# Patient Record
Sex: Male | Born: 1935 | Race: White | Hispanic: No | Marital: Married | Smoking: Never smoker
Health system: Southern US, Community
[De-identification: ages and names within clinical notes are randomized; demographics above are authoritative.]

## PROBLEM LIST (undated history)

## (undated) DIAGNOSIS — K219 Gastro-esophageal reflux disease without esophagitis: Secondary | ICD-10-CM

## (undated) DIAGNOSIS — M1990 Unspecified osteoarthritis, unspecified site: Secondary | ICD-10-CM

## (undated) DIAGNOSIS — E119 Type 2 diabetes mellitus without complications: Secondary | ICD-10-CM

## (undated) DIAGNOSIS — I10 Essential (primary) hypertension: Secondary | ICD-10-CM

## (undated) HISTORY — PX: COLONOSCOPY: SHX174

## (undated) HISTORY — PX: TONSILLECTOMY: SUR1361

---

## 1958-06-13 HISTORY — PX: VASCULAR SURGERY: SHX849

## 2001-07-10 ENCOUNTER — Ambulatory Visit (HOSPITAL_COMMUNITY): Admission: RE | Admit: 2001-07-10 | Discharge: 2001-07-10 | Payer: Self-pay | Admitting: Internal Medicine

## 2001-07-10 ENCOUNTER — Encounter: Payer: Self-pay | Admitting: Internal Medicine

## 2003-06-14 HISTORY — PX: FRACTURE SURGERY: SHX138

## 2015-08-06 DIAGNOSIS — Z9181 History of falling: Secondary | ICD-10-CM | POA: Diagnosis not present

## 2015-08-06 DIAGNOSIS — Z79899 Other long term (current) drug therapy: Secondary | ICD-10-CM | POA: Diagnosis not present

## 2015-08-06 DIAGNOSIS — Z1211 Encounter for screening for malignant neoplasm of colon: Secondary | ICD-10-CM | POA: Diagnosis not present

## 2015-08-06 DIAGNOSIS — E1165 Type 2 diabetes mellitus with hyperglycemia: Secondary | ICD-10-CM | POA: Diagnosis not present

## 2015-08-06 DIAGNOSIS — Z1389 Encounter for screening for other disorder: Secondary | ICD-10-CM | POA: Diagnosis not present

## 2015-08-06 DIAGNOSIS — K219 Gastro-esophageal reflux disease without esophagitis: Secondary | ICD-10-CM | POA: Diagnosis not present

## 2015-08-06 DIAGNOSIS — S46111A Strain of muscle, fascia and tendon of long head of biceps, right arm, initial encounter: Secondary | ICD-10-CM | POA: Diagnosis not present

## 2015-08-06 DIAGNOSIS — Z125 Encounter for screening for malignant neoplasm of prostate: Secondary | ICD-10-CM | POA: Diagnosis not present

## 2015-08-06 DIAGNOSIS — E782 Mixed hyperlipidemia: Secondary | ICD-10-CM | POA: Diagnosis not present

## 2015-08-06 DIAGNOSIS — I10 Essential (primary) hypertension: Secondary | ICD-10-CM | POA: Diagnosis not present

## 2015-08-17 DIAGNOSIS — E871 Hypo-osmolality and hyponatremia: Secondary | ICD-10-CM | POA: Diagnosis not present

## 2015-11-11 DIAGNOSIS — E782 Mixed hyperlipidemia: Secondary | ICD-10-CM | POA: Diagnosis not present

## 2015-11-11 DIAGNOSIS — E1165 Type 2 diabetes mellitus with hyperglycemia: Secondary | ICD-10-CM | POA: Diagnosis not present

## 2015-11-11 DIAGNOSIS — Z79899 Other long term (current) drug therapy: Secondary | ICD-10-CM | POA: Diagnosis not present

## 2015-11-16 DIAGNOSIS — E1165 Type 2 diabetes mellitus with hyperglycemia: Secondary | ICD-10-CM | POA: Diagnosis not present

## 2015-11-16 DIAGNOSIS — E782 Mixed hyperlipidemia: Secondary | ICD-10-CM | POA: Diagnosis not present

## 2015-11-16 DIAGNOSIS — I10 Essential (primary) hypertension: Secondary | ICD-10-CM | POA: Diagnosis not present

## 2016-04-21 DIAGNOSIS — Z23 Encounter for immunization: Secondary | ICD-10-CM | POA: Diagnosis not present

## 2016-05-20 DIAGNOSIS — E1165 Type 2 diabetes mellitus with hyperglycemia: Secondary | ICD-10-CM | POA: Diagnosis not present

## 2016-05-20 DIAGNOSIS — E782 Mixed hyperlipidemia: Secondary | ICD-10-CM | POA: Diagnosis not present

## 2016-05-27 DIAGNOSIS — I10 Essential (primary) hypertension: Secondary | ICD-10-CM | POA: Diagnosis not present

## 2016-05-27 DIAGNOSIS — E1165 Type 2 diabetes mellitus with hyperglycemia: Secondary | ICD-10-CM | POA: Diagnosis not present

## 2016-05-27 DIAGNOSIS — E782 Mixed hyperlipidemia: Secondary | ICD-10-CM | POA: Diagnosis not present

## 2016-07-04 DIAGNOSIS — S43421A Sprain of right rotator cuff capsule, initial encounter: Secondary | ICD-10-CM | POA: Diagnosis not present

## 2016-07-12 DIAGNOSIS — I10 Essential (primary) hypertension: Secondary | ICD-10-CM | POA: Diagnosis not present

## 2016-07-12 DIAGNOSIS — H353111 Nonexudative age-related macular degeneration, right eye, early dry stage: Secondary | ICD-10-CM | POA: Diagnosis not present

## 2016-07-12 DIAGNOSIS — H25813 Combined forms of age-related cataract, bilateral: Secondary | ICD-10-CM | POA: Diagnosis not present

## 2016-07-12 DIAGNOSIS — H524 Presbyopia: Secondary | ICD-10-CM | POA: Diagnosis not present

## 2016-07-12 DIAGNOSIS — E119 Type 2 diabetes mellitus without complications: Secondary | ICD-10-CM | POA: Diagnosis not present

## 2016-07-12 DIAGNOSIS — H5213 Myopia, bilateral: Secondary | ICD-10-CM | POA: Diagnosis not present

## 2016-07-12 DIAGNOSIS — Z7984 Long term (current) use of oral hypoglycemic drugs: Secondary | ICD-10-CM | POA: Diagnosis not present

## 2016-11-28 DIAGNOSIS — Z125 Encounter for screening for malignant neoplasm of prostate: Secondary | ICD-10-CM | POA: Diagnosis not present

## 2016-11-28 DIAGNOSIS — E782 Mixed hyperlipidemia: Secondary | ICD-10-CM | POA: Diagnosis not present

## 2016-11-28 DIAGNOSIS — E1165 Type 2 diabetes mellitus with hyperglycemia: Secondary | ICD-10-CM | POA: Diagnosis not present

## 2016-11-28 DIAGNOSIS — N138 Other obstructive and reflux uropathy: Secondary | ICD-10-CM | POA: Diagnosis not present

## 2016-11-28 DIAGNOSIS — N401 Enlarged prostate with lower urinary tract symptoms: Secondary | ICD-10-CM | POA: Diagnosis not present

## 2016-11-28 DIAGNOSIS — I10 Essential (primary) hypertension: Secondary | ICD-10-CM | POA: Diagnosis not present

## 2017-01-09 DIAGNOSIS — I10 Essential (primary) hypertension: Secondary | ICD-10-CM | POA: Diagnosis not present

## 2017-01-09 DIAGNOSIS — H25813 Combined forms of age-related cataract, bilateral: Secondary | ICD-10-CM | POA: Diagnosis not present

## 2017-04-12 DIAGNOSIS — Z23 Encounter for immunization: Secondary | ICD-10-CM | POA: Diagnosis not present

## 2017-05-23 DIAGNOSIS — M216X1 Other acquired deformities of right foot: Secondary | ICD-10-CM | POA: Diagnosis not present

## 2017-05-23 DIAGNOSIS — M2041 Other hammer toe(s) (acquired), right foot: Secondary | ICD-10-CM | POA: Diagnosis not present

## 2017-05-23 DIAGNOSIS — L84 Corns and callosities: Secondary | ICD-10-CM | POA: Diagnosis not present

## 2017-05-23 DIAGNOSIS — E119 Type 2 diabetes mellitus without complications: Secondary | ICD-10-CM | POA: Diagnosis not present

## 2017-06-09 DIAGNOSIS — K2970 Gastritis, unspecified, without bleeding: Secondary | ICD-10-CM | POA: Diagnosis not present

## 2017-06-09 DIAGNOSIS — T18120A Food in esophagus causing compression of trachea, initial encounter: Secondary | ICD-10-CM | POA: Diagnosis not present

## 2017-06-09 DIAGNOSIS — K209 Esophagitis, unspecified: Secondary | ICD-10-CM | POA: Diagnosis not present

## 2017-06-09 DIAGNOSIS — K219 Gastro-esophageal reflux disease without esophagitis: Secondary | ICD-10-CM | POA: Diagnosis not present

## 2017-06-09 DIAGNOSIS — K222 Esophageal obstruction: Secondary | ICD-10-CM | POA: Diagnosis not present

## 2017-06-09 DIAGNOSIS — T18128A Food in esophagus causing other injury, initial encounter: Secondary | ICD-10-CM | POA: Diagnosis not present

## 2017-06-09 DIAGNOSIS — K449 Diaphragmatic hernia without obstruction or gangrene: Secondary | ICD-10-CM | POA: Diagnosis not present

## 2017-06-20 DIAGNOSIS — E1165 Type 2 diabetes mellitus with hyperglycemia: Secondary | ICD-10-CM | POA: Diagnosis not present

## 2017-06-20 DIAGNOSIS — E782 Mixed hyperlipidemia: Secondary | ICD-10-CM | POA: Diagnosis not present

## 2017-06-20 DIAGNOSIS — I10 Essential (primary) hypertension: Secondary | ICD-10-CM | POA: Diagnosis not present

## 2017-06-23 DIAGNOSIS — I10 Essential (primary) hypertension: Secondary | ICD-10-CM | POA: Diagnosis not present

## 2017-06-23 DIAGNOSIS — Z8669 Personal history of other diseases of the nervous system and sense organs: Secondary | ICD-10-CM | POA: Diagnosis not present

## 2017-06-23 DIAGNOSIS — E1165 Type 2 diabetes mellitus with hyperglycemia: Secondary | ICD-10-CM | POA: Diagnosis not present

## 2017-06-23 DIAGNOSIS — E782 Mixed hyperlipidemia: Secondary | ICD-10-CM | POA: Diagnosis not present

## 2017-06-23 DIAGNOSIS — M1712 Unilateral primary osteoarthritis, left knee: Secondary | ICD-10-CM | POA: Diagnosis not present

## 2017-06-23 DIAGNOSIS — N401 Enlarged prostate with lower urinary tract symptoms: Secondary | ICD-10-CM | POA: Diagnosis not present

## 2017-06-23 DIAGNOSIS — Z0000 Encounter for general adult medical examination without abnormal findings: Secondary | ICD-10-CM | POA: Diagnosis not present

## 2017-06-23 DIAGNOSIS — Z23 Encounter for immunization: Secondary | ICD-10-CM | POA: Diagnosis not present

## 2017-06-30 DIAGNOSIS — R1310 Dysphagia, unspecified: Secondary | ICD-10-CM | POA: Diagnosis not present

## 2017-06-30 DIAGNOSIS — Z7984 Long term (current) use of oral hypoglycemic drugs: Secondary | ICD-10-CM | POA: Diagnosis not present

## 2017-06-30 DIAGNOSIS — Z7982 Long term (current) use of aspirin: Secondary | ICD-10-CM | POA: Diagnosis not present

## 2017-06-30 DIAGNOSIS — I10 Essential (primary) hypertension: Secondary | ICD-10-CM | POA: Diagnosis not present

## 2017-06-30 DIAGNOSIS — K449 Diaphragmatic hernia without obstruction or gangrene: Secondary | ICD-10-CM | POA: Diagnosis not present

## 2017-06-30 DIAGNOSIS — Z79899 Other long term (current) drug therapy: Secondary | ICD-10-CM | POA: Diagnosis not present

## 2017-06-30 DIAGNOSIS — E119 Type 2 diabetes mellitus without complications: Secondary | ICD-10-CM | POA: Diagnosis not present

## 2017-06-30 DIAGNOSIS — K219 Gastro-esophageal reflux disease without esophagitis: Secondary | ICD-10-CM | POA: Diagnosis not present

## 2017-06-30 DIAGNOSIS — K222 Esophageal obstruction: Secondary | ICD-10-CM | POA: Diagnosis not present

## 2017-07-06 DIAGNOSIS — M1712 Unilateral primary osteoarthritis, left knee: Secondary | ICD-10-CM | POA: Diagnosis not present

## 2017-10-19 DIAGNOSIS — M5416 Radiculopathy, lumbar region: Secondary | ICD-10-CM | POA: Diagnosis not present

## 2018-01-11 DIAGNOSIS — E119 Type 2 diabetes mellitus without complications: Secondary | ICD-10-CM | POA: Diagnosis not present

## 2018-06-19 DIAGNOSIS — E782 Mixed hyperlipidemia: Secondary | ICD-10-CM | POA: Diagnosis not present

## 2018-06-19 DIAGNOSIS — E1165 Type 2 diabetes mellitus with hyperglycemia: Secondary | ICD-10-CM | POA: Diagnosis not present

## 2018-06-19 DIAGNOSIS — G609 Hereditary and idiopathic neuropathy, unspecified: Secondary | ICD-10-CM | POA: Diagnosis not present

## 2018-06-19 DIAGNOSIS — K219 Gastro-esophageal reflux disease without esophagitis: Secondary | ICD-10-CM | POA: Diagnosis not present

## 2018-06-19 DIAGNOSIS — I10 Essential (primary) hypertension: Secondary | ICD-10-CM | POA: Diagnosis not present

## 2018-07-27 DIAGNOSIS — L820 Inflamed seborrheic keratosis: Secondary | ICD-10-CM | POA: Diagnosis not present

## 2018-07-27 DIAGNOSIS — D1801 Hemangioma of skin and subcutaneous tissue: Secondary | ICD-10-CM | POA: Diagnosis not present

## 2018-07-27 DIAGNOSIS — L578 Other skin changes due to chronic exposure to nonionizing radiation: Secondary | ICD-10-CM | POA: Diagnosis not present

## 2018-07-27 DIAGNOSIS — L821 Other seborrheic keratosis: Secondary | ICD-10-CM | POA: Diagnosis not present

## 2018-12-18 DIAGNOSIS — Z125 Encounter for screening for malignant neoplasm of prostate: Secondary | ICD-10-CM | POA: Diagnosis not present

## 2018-12-18 DIAGNOSIS — E782 Mixed hyperlipidemia: Secondary | ICD-10-CM | POA: Diagnosis not present

## 2018-12-18 DIAGNOSIS — M5416 Radiculopathy, lumbar region: Secondary | ICD-10-CM | POA: Diagnosis not present

## 2018-12-18 DIAGNOSIS — I10 Essential (primary) hypertension: Secondary | ICD-10-CM | POA: Diagnosis not present

## 2018-12-18 DIAGNOSIS — Z0000 Encounter for general adult medical examination without abnormal findings: Secondary | ICD-10-CM | POA: Diagnosis not present

## 2018-12-18 DIAGNOSIS — M8949 Other hypertrophic osteoarthropathy, multiple sites: Secondary | ICD-10-CM | POA: Diagnosis not present

## 2018-12-18 DIAGNOSIS — E1165 Type 2 diabetes mellitus with hyperglycemia: Secondary | ICD-10-CM | POA: Diagnosis not present

## 2018-12-18 DIAGNOSIS — M1712 Unilateral primary osteoarthritis, left knee: Secondary | ICD-10-CM | POA: Diagnosis not present

## 2018-12-18 DIAGNOSIS — M21162 Varus deformity, not elsewhere classified, left knee: Secondary | ICD-10-CM | POA: Diagnosis not present

## 2018-12-18 DIAGNOSIS — K219 Gastro-esophageal reflux disease without esophagitis: Secondary | ICD-10-CM | POA: Diagnosis not present

## 2018-12-18 DIAGNOSIS — Z1211 Encounter for screening for malignant neoplasm of colon: Secondary | ICD-10-CM | POA: Diagnosis not present

## 2019-01-07 ENCOUNTER — Other Ambulatory Visit: Payer: Self-pay | Admitting: Orthopedic Surgery

## 2019-01-09 ENCOUNTER — Encounter (HOSPITAL_COMMUNITY): Payer: Self-pay

## 2019-01-09 NOTE — Patient Instructions (Addendum)
DUE TO COVID-19 ONLY ONE VISITOR IS ALLOWED IN THE HOSPITAL AT THIS TIME   COVID SWAB TESTING MUST BE COMPLETED ON: January 10, 2019 at 2:15PM.  [address] [place] -[facility] enter pre surgical testing line (Must self quarantine after testing. Follow instructions on handout.)             Your procedure is scheduled on: Monday, Jan 14, 2019   Report to [facility] Main  Entrance   Report to Short Stay at 5:30 AM   Call this number if you have problems the morning of surgery [phone]   Do not eat food:After Midnight.   May have liquids until 4:15 AM day of surgery   CLEAR LIQUID DIET  Foods Allowed                                                                     Foods Excluded  Water, Black Coffee and tea, regular and decaf                             liquids that you cannot  Plain Jell-O in any flavor  (No red)                                           see through such as: Fruit ices (not with fruit pulp)                                     milk, soups, orange juice  Iced Popsicles (No red)                                    All solid food Carbonated beverages, regular and diet                                    Apple juices Sports drinks like Gatorade (No red) Lightly seasoned clear broth or consume(fat free) Sugar, honey syrup    Complete one G2 drink the morning of surgery at 4:15AM the day of surgery.   Brush your teeth the morning of surgery.   Do NOT smoke after Midnight   Take these medicines the morning of surgery with A SIP OF WATER: Amlodipine, Carvedilol, Omeprazole   How to Manage Your Diabetes Before and After Surgery  Why is it important to control my blood sugar before and after surgery? . Improving blood sugar levels before and after surgery helps healing and can limit problems. . A way of improving blood sugar control is eating a healthy diet by: o  Eating less sugar and carbohydrates o  Increasing  activity/exercise o  Talking with your doctor about reaching your blood sugar goals . High blood sugars (greater than 180 mg/dL) can raise your risk of infections and slow your recovery, so you will need to focus on controlling your  diabetes during the weeks before surgery. . Make sure that the doctor who takes care of your diabetes knows about your planned surgery including the date and location.  How do I manage my blood sugar before surgery? . Check your blood sugar at least 4 times a day, starting 2 days before surgery, to make sure that the level is not too high or low. o Check your blood sugar the morning of your surgery when you wake up and every 2 hours until you get to the Short Stay unit. . If your blood sugar is less than 70 mg/dL, you will need to treat for low blood sugar: o Do not take insulin. o Treat a low blood sugar (less than 70 mg/dL) with  cup of clear juice (cranberry or apple), 4 glucose tablets, OR glucose gel. o Recheck blood sugar in 15 minutes after treatment (to make sure it is greater than 70 mg/dL). If your blood sugar is not greater than 70 mg/dL on recheck, call [phone] for further instructions. . Report your blood sugar to the short stay nurse when you get to Short Stay.  . If you are admitted to the hospital after surgery: o Your blood sugar will be checked by the staff and you will probably be given insulin after surgery (instead of oral diabetes medicines) to make sure you have good blood sugar levels. o The goal for blood sugar control after surgery is 80-180 mg/dL.   WHAT DO I DO ABOUT MY DIABETES MEDICATION?  Marland Kitchen Do not take oral diabetes medicines (pills) the morning of surgery      . The day of surgery, do not take other diabetes injectables, including Byetta (exenatide), Bydureon (exenatide ER), Victoza (liraglutide), or Trulicity (dulaglutide).  . If your CBG is greater than 220 mg/dL, you may take  of your sliding scale  . (correction) dose  of insulin.      TAKE DIABETIC MEDICATION PER NORMAL ROUTINE THE DAY BEFORE SURGERY  DO NOT TAKE ANY DIABETIC MEDICATIONS DAY OF YOUR SURGERY                                 You may not have any metal on your body including jewelry, and body piercings             Do not wear lotions, powders, perfumes/cologne, or deodorant                           Men may shave face and neck.   Do not bring valuables to the hospital. [place].   Contacts, dentures or bridgework may not be worn into surgery.   Bring small overnight bag day of surgery.    Special Instructions: Bring a copy of your healthcare power of attorney and living will documents         the day of surgery if you haven't scanned them in before.              Please read over the following fact sheets you were given:  [facility] - Preparing for Surgery Before surgery, you can play an important role.   Because skin is not sterile, your skin needs to be as free of germs as possible.   You can reduce the number of germs  on your skin by washing with CHG (chlorahexidine gluconate) soap before surgery .  CHG is an antiseptic cleaner which kills germs and bonds with the skin to continue killing germs even after washing. Please DO NOT use if you have an allergy to CHG or antibacterial soaps.   If your skin becomes reddened/irritated stop using the CHG and inform your nurse when you arrive at Short Stay.   You may shave your face/neck.  Please follow these instructions carefully:  1.  Shower with CHG Soap the night before surgery and the  morning of surgery.  2.  If you choose to wash your hair, wash your hair first as usual with your normal  shampoo.  3.  After you shampoo, rinse your hair and body thoroughly to remove the shampoo.                             4.  Use CHG as you would any other liquid soap.  You can apply chg directly to the skin and wash.  Gently with a scrungie or  clean washcloth.  5.  Apply the CHG Soap to your body ONLY FROM THE NECK DOWN.   Do  not use on face/ open                           Wound or open sores. Avoid contact with eyes, ears mouth and genitals (private parts).                       Wash face,  Genitals (private parts) with your normal soap.             6.  Wash thoroughly, paying special attention to the area where your surgery  will be performed.  7.  Thoroughly rinse your body with warm water from the neck down.  8.  DO NOT shower/wash with your normal soap after using and rinsing off the CHG Soap.                9.  Pat yourself dry with a clean towel.            10.  Wear clean pajamas.            11.  Place clean sheets on your bed the night of your first shower and do not  sleep with pets. Day of Surgery : Do not apply any lotions/deodorants the morning of surgery.  Please wear clean clothes to the hospital/surgery center.  FAILURE TO FOLLOW THESE INSTRUCTIONS MAY RESULT IN THE CANCELLATION OF YOUR SURGERY  PATIENT SIGNATURE_________________________________  NURSE SIGNATURE__________________________________  ________________________________________________________________________   Adam Phenix  An incentive spirometer is a tool that can help keep your lungs clear and active. This tool measures how well you are filling your lungs with each breath. Taking long deep breaths may help reverse or decrease the chance of developing breathing (pulmonary) problems (especially infection) following:  A long period of time when you are unable to move or be active. BEFORE THE PROCEDURE   If the spirometer includes an indicator to show your best effort, your nurse or respiratory therapist will set it to a desired goal.  If possible, sit up straight or lean slightly forward. Try not to slouch.  Hold the incentive spirometer in an upright position. INSTRUCTIONS FOR USE  1. Sit on the edge of your bed if possible,  or sit up  as far as you can in bed or on a chair. 2. Hold the incentive spirometer in an upright position. 3. Breathe out normally. 4. Place the mouthpiece in your mouth and seal your lips tightly around it. 5. Breathe in slowly and as deeply as possible, raising the piston or the ball toward the top of the column. 6. Hold your breath for 3-5 seconds or for as long as possible. Allow the piston or ball to fall to the bottom of the column. 7. Remove the mouthpiece from your mouth and breathe out normally. 8. Rest for a few seconds and repeat Steps 1 through 7 at least 10 times every 1-2 hours when you are awake. Take your time and take a few normal breaths between deep breaths. 9. The spirometer may include an indicator to show your best effort. Use the indicator as a goal to work toward during each repetition. 10. After each set of 10 deep breaths, practice coughing to be sure your lungs are clear. If you have an incision (the cut made at the time of surgery), support your incision when coughing by placing a pillow or rolled up towels firmly against it. Once you are able to get out of bed, walk around indoors and cough well. You may stop using the incentive spirometer when instructed by your caregiver.  RISKS AND COMPLICATIONS  Take your time so you do not get dizzy or light-headed.  If you are in pain, you may need to take or ask for pain medication before doing incentive spirometry. It is harder to take a deep breath if you are having pain. AFTER USE  Rest and breathe slowly and easily.  It can be helpful to keep track of a log of your progress. Your caregiver can provide you with a simple table to help with this. If you are using the spirometer at home, follow these instructions: [place] IF:   You are having difficultly using the spirometer.  You have trouble using the spirometer as often as instructed.  Your pain medication is not giving enough relief while using the  spirometer.  You develop fever of 100.5 F (38.1 C) or higher. SEEK IMMEDIATE MEDICAL CARE IF:   You cough up bloody sputum that had not been present before.  You develop fever of 102 F (38.9 C) or greater.  You develop worsening pain at or near the incision site. MAKE SURE YOU:   Understand these instructions.  Will watch your condition.  Will get help right away if you are not doing well or get worse. Document Released: 10/10/2006 Document Revised: 08/22/2011 Document Reviewed: 12/11/2006 [facility] Patient Information 2014 [place], [place].   ________________________________________________________________________

## 2019-01-10 ENCOUNTER — Encounter (HOSPITAL_COMMUNITY): Payer: Self-pay

## 2019-01-10 ENCOUNTER — Other Ambulatory Visit: Payer: Self-pay

## 2019-01-10 ENCOUNTER — Other Ambulatory Visit (HOSPITAL_COMMUNITY)
Admission: RE | Admit: 2019-01-10 | Discharge: 2019-01-10 | Disposition: A | Discharge status: Home / Self Care | Payer: PPO | Source: Ambulatory Visit | Attending: Orthopedic Surgery | Admitting: Orthopedic Surgery

## 2019-01-10 ENCOUNTER — Encounter (HOSPITAL_COMMUNITY)
Admission: RE | Admit: 2019-01-10 | Discharge: 2019-01-10 | Disposition: A | Discharge status: Home / Self Care | Payer: PPO | Source: Ambulatory Visit | Attending: Orthopedic Surgery | Admitting: Orthopedic Surgery

## 2019-01-10 DIAGNOSIS — Z01818 Encounter for other preprocedural examination: Secondary | ICD-10-CM | POA: Insufficient documentation

## 2019-01-10 DIAGNOSIS — Z7984 Long term (current) use of oral hypoglycemic drugs: Secondary | ICD-10-CM | POA: Insufficient documentation

## 2019-01-10 DIAGNOSIS — E119 Type 2 diabetes mellitus without complications: Secondary | ICD-10-CM | POA: Insufficient documentation

## 2019-01-10 DIAGNOSIS — Z20828 Contact with and (suspected) exposure to other viral communicable diseases: Secondary | ICD-10-CM | POA: Diagnosis not present

## 2019-01-10 DIAGNOSIS — K219 Gastro-esophageal reflux disease without esophagitis: Secondary | ICD-10-CM | POA: Diagnosis not present

## 2019-01-10 DIAGNOSIS — Z7982 Long term (current) use of aspirin: Secondary | ICD-10-CM | POA: Diagnosis not present

## 2019-01-10 DIAGNOSIS — M1712 Unilateral primary osteoarthritis, left knee: Secondary | ICD-10-CM | POA: Insufficient documentation

## 2019-01-10 DIAGNOSIS — Z79899 Other long term (current) drug therapy: Secondary | ICD-10-CM | POA: Diagnosis not present

## 2019-01-10 DIAGNOSIS — I10 Essential (primary) hypertension: Secondary | ICD-10-CM | POA: Diagnosis not present

## 2019-01-10 HISTORY — DX: Unspecified osteoarthritis, unspecified site: M19.90

## 2019-01-10 HISTORY — DX: Essential (primary) hypertension: I10

## 2019-01-10 HISTORY — DX: Gastro-esophageal reflux disease without esophagitis: K21.9

## 2019-01-10 HISTORY — DX: Type 2 diabetes mellitus without complications: E11.9

## 2019-01-10 LAB — CBC WITH DIFFERENTIAL/PLATELET
Abs Immature Granulocytes: 0.01 10*3/uL (ref 0.00–0.07)
Basophils Absolute: 0.1 10*3/uL (ref 0.0–0.1)
Basophils Relative: 1 %
Eosinophils Absolute: 0.3 10*3/uL (ref 0.0–0.5)
Eosinophils Relative: 5 %
HCT: 38.5 % — ABNORMAL LOW (ref 39.0–52.0)
Hemoglobin: 12.7 g/dL — ABNORMAL LOW (ref 13.0–17.0)
Immature Granulocytes: 0 %
Lymphocytes Relative: 20 %
Lymphs Abs: 1.3 10*3/uL (ref 0.7–4.0)
MCH: 32.2 pg (ref 26.0–34.0)
MCHC: 33 g/dL (ref 30.0–36.0)
MCV: 97.5 fL (ref 80.0–100.0)
Monocytes Absolute: 0.8 10*3/uL (ref 0.1–1.0)
Monocytes Relative: 12 %
Neutro Abs: 4 10*3/uL (ref 1.7–7.7)
Neutrophils Relative %: 62 %
Platelets: 201 10*3/uL (ref 150–400)
RBC: 3.95 MIL/uL — ABNORMAL LOW (ref 4.22–5.81)
RDW: 12.8 % (ref 11.5–15.5)
WBC: 6.4 10*3/uL (ref 4.0–10.5)
nRBC: 0 % (ref 0.0–0.2)

## 2019-01-10 LAB — COMPREHENSIVE METABOLIC PANEL
ALT: 24 U/L (ref 0–44)
AST: 22 U/L (ref 15–41)
Albumin: 4.2 g/dL (ref 3.5–5.0)
Alkaline Phosphatase: 47 U/L (ref 38–126)
Anion gap: 9 (ref 5–15)
BUN: 16 mg/dL (ref 8–23)
CO2: 26 mmol/L (ref 22–32)
Calcium: 9.3 mg/dL (ref 8.9–10.3)
Chloride: 101 mmol/L (ref 98–111)
Creatinine, Ser: 0.81 mg/dL (ref 0.61–1.24)
GFR calc Af Amer: >60 mL/min (ref >60)
GFR calc non Af Amer: >60 mL/min (ref >60)
Glucose, Bld: 103 mg/dL — ABNORMAL HIGH (ref 70–99)
Potassium: 4.7 mmol/L (ref 3.5–5.1)
Sodium: 136 mmol/L (ref 135–145)
Total Bilirubin: 0.6 mg/dL (ref 0.3–1.2)
Total Protein: 7.5 g/dL (ref 6.5–8.1)

## 2019-01-10 LAB — HEMOGLOBIN A1C
Hgb A1c MFr Bld: 6.5 % — ABNORMAL HIGH (ref 4.8–5.6)
Mean Plasma Glucose: 139.85 mg/dL

## 2019-01-10 LAB — SARS CORONAVIRUS 2 (TAT 6-24 HRS): SARS Coronavirus 2: NEGATIVE

## 2019-01-10 LAB — GLUCOSE, CAPILLARY: Glucose-Capillary: 92 mg/dL (ref 70–99)

## 2019-01-10 NOTE — Progress Notes (Signed)
ASA stopped 7/24 per Dr. Ronnie Derby Labs and EKG are in [place]

## 2019-01-11 LAB — SURGICAL PCR SCREEN
MRSA, PCR: POSITIVE — AB
Staphylococcus aureus: POSITIVE — AB

## 2019-01-13 MED ORDER — BUPIVACAINE LIPOSOME 1.3 % IJ SUSP
20.0000 mL | Freq: Once | INTRAMUSCULAR | Status: DC
  Filled 2019-01-13: qty 20

## 2019-01-13 NOTE — Anesthesia Preprocedure Evaluation (Addendum)
Anesthesia Evaluation    Reviewed: Allergy & Precautions, Patient's Chart, lab work & pertinent test results  Airway Mallampati: II  TM Distance: >3 FB Neck ROM: Full    Dental  (+) Teeth Intact, Dental Advisory Given   Pulmonary neg pulmonary ROS,    Pulmonary exam normal breath sounds clear to auscultation       Cardiovascular hypertension, Pt. on home beta blockers and Pt. on medications (-) angina(-) CAD, (-) Past MI and (-) Cardiac Stents Normal cardiovascular exam Rhythm:Regular Rate:Normal     Neuro/Psych negative neurological ROS  negative psych ROS   GI/Hepatic negative GI ROS, Neg liver ROS, GERD  ,  Endo/Other  diabetes, Type 2, Oral Hypoglycemic Agents  Renal/GU negative Renal ROS     Musculoskeletal negative musculoskeletal ROS (+) Arthritis  (Lt. Knee Osteoarthritis),   Abdominal   Peds  Hematology  (+) Blood dyscrasia (Plt 201k), anemia ,   Anesthesia Other Findings Day of surgery medications reviewed with the patient.  Reproductive/Obstetrics                           Anesthesia Physical Anesthesia Plan  ASA: III  Anesthesia Plan: Spinal   Post-op Pain Management:  Regional for Post-op pain   Induction: Intravenous  PONV Risk Score and Plan: 1 and Propofol infusion and Treatment may vary due to age or medical condition  Airway Management Planned: Natural Airway and Nasal Cannula  Additional Equipment:   Intra-op Plan:   Post-operative Plan:   Informed Consent: I have reviewed the patients History and Physical, chart, labs and discussed the procedure including the risks, benefits and alternatives for the proposed anesthesia with the patient or authorized representative who has indicated his/her understanding and acceptance.     Dental advisory given  Plan Discussed with: CRNA  Anesthesia Plan Comments:        Anesthesia Quick Evaluation

## 2019-01-14 ENCOUNTER — Ambulatory Visit (HOSPITAL_COMMUNITY): Payer: PPO | Admitting: Physician Assistant

## 2019-01-14 ENCOUNTER — Ambulatory Visit (HOSPITAL_COMMUNITY): Payer: PPO | Admitting: Anesthesiology

## 2019-01-14 ENCOUNTER — Observation Stay (HOSPITAL_COMMUNITY)
Admission: AD | Admit: 2019-01-14 | Discharge: 2019-01-15 | Disposition: A | Discharge status: Home / Self Care | Payer: PPO | Attending: Orthopedic Surgery | Admitting: Orthopedic Surgery

## 2019-01-14 ENCOUNTER — Other Ambulatory Visit: Payer: Self-pay

## 2019-01-14 ENCOUNTER — Encounter (HOSPITAL_COMMUNITY): Payer: Self-pay | Admitting: *Deleted

## 2019-01-14 ENCOUNTER — Encounter (HOSPITAL_COMMUNITY)
Admission: AD | Disposition: A | Discharge status: Home / Self Care | Payer: Self-pay | Source: Home / Self Care | Attending: Orthopedic Surgery

## 2019-01-14 DIAGNOSIS — G8918 Other acute postprocedural pain: Secondary | ICD-10-CM | POA: Diagnosis not present

## 2019-01-14 DIAGNOSIS — Z7982 Long term (current) use of aspirin: Secondary | ICD-10-CM | POA: Diagnosis not present

## 2019-01-14 DIAGNOSIS — Z96659 Presence of unspecified artificial knee joint: Secondary | ICD-10-CM

## 2019-01-14 DIAGNOSIS — I110 Hypertensive heart disease with heart failure: Secondary | ICD-10-CM | POA: Diagnosis not present

## 2019-01-14 DIAGNOSIS — K219 Gastro-esophageal reflux disease without esophagitis: Secondary | ICD-10-CM | POA: Diagnosis not present

## 2019-01-14 DIAGNOSIS — I10 Essential (primary) hypertension: Secondary | ICD-10-CM | POA: Diagnosis not present

## 2019-01-14 DIAGNOSIS — M25762 Osteophyte, left knee: Secondary | ICD-10-CM | POA: Insufficient documentation

## 2019-01-14 DIAGNOSIS — Z79899 Other long term (current) drug therapy: Secondary | ICD-10-CM | POA: Diagnosis not present

## 2019-01-14 DIAGNOSIS — M25562 Pain in left knee: Secondary | ICD-10-CM | POA: Diagnosis present

## 2019-01-14 DIAGNOSIS — M1712 Unilateral primary osteoarthritis, left knee: Secondary | ICD-10-CM | POA: Diagnosis not present

## 2019-01-14 DIAGNOSIS — E119 Type 2 diabetes mellitus without complications: Secondary | ICD-10-CM | POA: Diagnosis not present

## 2019-01-14 DIAGNOSIS — Z7984 Long term (current) use of oral hypoglycemic drugs: Secondary | ICD-10-CM | POA: Diagnosis not present

## 2019-01-14 HISTORY — PX: TOTAL KNEE ARTHROPLASTY: SHX125

## 2019-01-14 LAB — GLUCOSE, CAPILLARY
Glucose-Capillary: 136 mg/dL — ABNORMAL HIGH (ref 70–99)
Glucose-Capillary: 149 mg/dL — ABNORMAL HIGH (ref 70–99)
Glucose-Capillary: 189 mg/dL — ABNORMAL HIGH (ref 70–99)
Glucose-Capillary: 193 mg/dL — ABNORMAL HIGH (ref 70–99)
Glucose-Capillary: 181 mg/dL — ABNORMAL HIGH (ref 70–99)

## 2019-01-14 SURGERY — ARTHROPLASTY, KNEE, TOTAL
Anesthesia: Spinal | Site: Knee | Laterality: Left

## 2019-01-14 MED ORDER — PHENOL 1.4 % MT LIQD: 1.0000 | OROMUCOSAL | Status: DC | PRN

## 2019-01-14 MED ORDER — SODIUM CHLORIDE 0.9 % IV SOLN
INTRAVENOUS | Status: DC
  Administered 2019-01-14: 11:00:00 via INTRAVENOUS

## 2019-01-14 MED ORDER — DEXAMETHASONE SODIUM PHOSPHATE 10 MG/ML IJ SOLN
INTRAMUSCULAR | Status: AC
  Filled 2019-01-14: qty 1

## 2019-01-14 MED ORDER — TRANEXAMIC ACID-NACL 1000-0.7 MG/100ML-% IV SOLN
1000.0000 mg | INTRAVENOUS | Status: AC
  Administered 2019-01-14: 08:00:00 1000 mg via INTRAVENOUS
  Filled 2019-01-14: qty 100

## 2019-01-14 MED ORDER — ALUM & MAG HYDROXIDE-SIMETH 200-200-20 MG/5ML PO SUSP: 30.0000 mL | ORAL | Status: DC | PRN

## 2019-01-14 MED ORDER — METHOCARBAMOL 500 MG IVPB - SIMPLE MED
500.0000 mg | Freq: Four times a day (QID) | INTRAVENOUS | Status: DC | PRN
  Administered 2019-01-14: 10:00:00 500 mg via INTRAVENOUS
  Filled 2019-01-14: qty 50

## 2019-01-14 MED ORDER — AMLODIPINE BESYLATE 5 MG PO TABS
5.0000 mg | ORAL_TABLET | Freq: Every day | ORAL | Status: DC
  Administered 2019-01-15: 09:00:00 5 mg via ORAL
  Filled 2019-01-14: qty 1

## 2019-01-14 MED ORDER — ONDANSETRON HCL 4 MG/2ML IJ SOLN: 4.0000 mg | Freq: Four times a day (QID) | INTRAMUSCULAR | Status: DC | PRN

## 2019-01-14 MED ORDER — PRAVASTATIN SODIUM 20 MG PO TABS
40.0000 mg | ORAL_TABLET | Freq: Every day | ORAL | Status: DC
  Administered 2019-01-14: 17:00:00 40 mg via ORAL
  Filled 2019-01-14: qty 2

## 2019-01-14 MED ORDER — ASPIRIN EC 325 MG PO TBEC
325.0000 mg | DELAYED_RELEASE_TABLET | Freq: Two times a day (BID) | ORAL | Status: DC
  Administered 2019-01-15: 09:00:00 325 mg via ORAL
  Filled 2019-01-14: qty 1

## 2019-01-14 MED ORDER — CELECOXIB 200 MG PO CAPS
400.0000 mg | ORAL_CAPSULE | Freq: Once | ORAL | Status: AC
  Administered 2019-01-14: 06:00:00 400 mg via ORAL
  Filled 2019-01-14: qty 2

## 2019-01-14 MED ORDER — MENTHOL 3 MG MT LOZG: 1.0000 | LOZENGE | OROMUCOSAL | Status: DC | PRN

## 2019-01-14 MED ORDER — ACETAMINOPHEN 500 MG PO TABS
1000.0000 mg | ORAL_TABLET | Freq: Once | ORAL | Status: AC
  Administered 2019-01-14: 06:00:00 1000 mg via ORAL
  Filled 2019-01-14: qty 2

## 2019-01-14 MED ORDER — METFORMIN HCL 500 MG PO TABS
1000.0000 mg | ORAL_TABLET | Freq: Two times a day (BID) | ORAL | Status: DC
  Administered 2019-01-14 – 2019-01-15 (×2): 1000 mg via ORAL
  Filled 2019-01-14 (×2): qty 2

## 2019-01-14 MED ORDER — GABAPENTIN 300 MG PO CAPS
300.0000 mg | ORAL_CAPSULE | Freq: Three times a day (TID) | ORAL | Status: DC
  Administered 2019-01-14 – 2019-01-15 (×3): 300 mg via ORAL
  Filled 2019-01-14 (×3): qty 1

## 2019-01-14 MED ORDER — BUPIVACAINE IN DEXTROSE 0.75-8.25 % IT SOLN
INTRATHECAL | Status: DC | PRN
  Administered 2019-01-14: 1.6 mL via INTRATHECAL

## 2019-01-14 MED ORDER — DOCUSATE SODIUM 100 MG PO CAPS
100.0000 mg | ORAL_CAPSULE | Freq: Two times a day (BID) | ORAL | Status: DC
  Administered 2019-01-14 – 2019-01-15 (×2): 100 mg via ORAL
  Filled 2019-01-14 (×2): qty 1

## 2019-01-14 MED ORDER — LIDOCAINE 2% (20 MG/ML) 5 ML SYRINGE
INTRAMUSCULAR | Status: AC
  Filled 2019-01-14: qty 5

## 2019-01-14 MED ORDER — ONDANSETRON HCL 4 MG PO TABS: 4.0000 mg | ORAL_TABLET | Freq: Four times a day (QID) | ORAL | Status: DC | PRN

## 2019-01-14 MED ORDER — LACTATED RINGERS IV SOLN
INTRAVENOUS | Status: DC
  Administered 2019-01-14: 06:00:00 via INTRAVENOUS

## 2019-01-14 MED ORDER — GABAPENTIN 300 MG PO CAPS
300.0000 mg | ORAL_CAPSULE | Freq: Once | ORAL | Status: AC
  Administered 2019-01-14: 06:00:00 300 mg via ORAL
  Filled 2019-01-14: qty 1

## 2019-01-14 MED ORDER — PROPOFOL 10 MG/ML IV BOLUS
INTRAVENOUS | Status: DC | PRN
  Administered 2019-01-14 (×2): 10 mg via INTRAVENOUS

## 2019-01-14 MED ORDER — LISINOPRIL 10 MG PO TABS
10.0000 mg | ORAL_TABLET | Freq: Every day | ORAL | Status: DC
  Administered 2019-01-14: 22:00:00 10 mg via ORAL
  Filled 2019-01-14: qty 1

## 2019-01-14 MED ORDER — WATER FOR IRRIGATION, STERILE IR SOLN
Status: DC | PRN
  Administered 2019-01-14: 2000 mL

## 2019-01-14 MED ORDER — EPHEDRINE 5 MG/ML INJ
INTRAVENOUS | Status: AC
  Filled 2019-01-14: qty 10

## 2019-01-14 MED ORDER — 0.9 % SODIUM CHLORIDE (POUR BTL) OPTIME
TOPICAL | Status: DC | PRN
  Administered 2019-01-14: 08:00:00 1000 mL

## 2019-01-14 MED ORDER — CHLORHEXIDINE GLUCONATE 4 % EX LIQD: 60.0000 mL | Freq: Once | CUTANEOUS | Status: DC

## 2019-01-14 MED ORDER — FENTANYL CITRATE (PF) 100 MCG/2ML IJ SOLN
INTRAMUSCULAR | Status: DC | PRN
  Administered 2019-01-14: 25 ug via INTRAVENOUS
  Administered 2019-01-14: 50 ug via INTRAVENOUS

## 2019-01-14 MED ORDER — LIDOCAINE HCL (CARDIAC) PF 100 MG/5ML IV SOSY
PREFILLED_SYRINGE | INTRAVENOUS | Status: DC | PRN
  Administered 2019-01-14: 30 mg via INTRAVENOUS

## 2019-01-14 MED ORDER — INSULIN ASPART 100 UNIT/ML ~~LOC~~ SOLN
0.0000 [IU] | Freq: Three times a day (TID) | SUBCUTANEOUS | Status: DC
  Administered 2019-01-14 (×2): 3 [IU] via SUBCUTANEOUS

## 2019-01-14 MED ORDER — BISACODYL 5 MG PO TBEC: 5.0000 mg | DELAYED_RELEASE_TABLET | Freq: Every day | ORAL | Status: DC | PRN

## 2019-01-14 MED ORDER — FENTANYL CITRATE (PF) 100 MCG/2ML IJ SOLN
INTRAMUSCULAR | Status: AC
  Filled 2019-01-14: qty 2

## 2019-01-14 MED ORDER — ZOLPIDEM TARTRATE 5 MG PO TABS: 5.0000 mg | ORAL_TABLET | Freq: Every evening | ORAL | Status: DC | PRN

## 2019-01-14 MED ORDER — OXYCODONE HCL 5 MG PO TABS: 5.0000 mg | ORAL_TABLET | ORAL | Status: DC | PRN

## 2019-01-14 MED ORDER — PROPOFOL 10 MG/ML IV BOLUS
INTRAVENOUS | Status: AC
  Filled 2019-01-14: qty 40

## 2019-01-14 MED ORDER — DEXAMETHASONE SODIUM PHOSPHATE 10 MG/ML IJ SOLN
8.0000 mg | Freq: Once | INTRAMUSCULAR | Status: AC
  Administered 2019-01-14: 08:00:00 8 mg via INTRAVENOUS

## 2019-01-14 MED ORDER — METHOCARBAMOL 500 MG PO TABS
500.0000 mg | ORAL_TABLET | Freq: Four times a day (QID) | ORAL | Status: DC | PRN
  Administered 2019-01-15: 09:00:00 500 mg via ORAL
  Filled 2019-01-14: qty 1

## 2019-01-14 MED ORDER — ONDANSETRON HCL 4 MG/2ML IJ SOLN: 4.0000 mg | Freq: Once | INTRAMUSCULAR | Status: DC | PRN

## 2019-01-14 MED ORDER — METOCLOPRAMIDE HCL 5 MG/ML IJ SOLN: 5.0000 mg | Freq: Three times a day (TID) | INTRAMUSCULAR | Status: DC | PRN

## 2019-01-14 MED ORDER — DEXAMETHASONE SODIUM PHOSPHATE 10 MG/ML IJ SOLN
10.0000 mg | Freq: Once | INTRAMUSCULAR | Status: AC
  Administered 2019-01-15: 09:00:00 10 mg via INTRAVENOUS
  Filled 2019-01-14: qty 1

## 2019-01-14 MED ORDER — CARVEDILOL 12.5 MG PO TABS
12.5000 mg | ORAL_TABLET | Freq: Two times a day (BID) | ORAL | Status: DC
  Administered 2019-01-14 – 2019-01-15 (×2): 12.5 mg via ORAL
  Filled 2019-01-14 (×2): qty 1

## 2019-01-14 MED ORDER — EPHEDRINE SULFATE-NACL 50-0.9 MG/10ML-% IV SOSY
PREFILLED_SYRINGE | INTRAVENOUS | Status: DC | PRN
  Administered 2019-01-14: 5 mg via INTRAVENOUS

## 2019-01-14 MED ORDER — ONDANSETRON HCL 4 MG/2ML IJ SOLN
INTRAMUSCULAR | Status: AC
  Filled 2019-01-14: qty 2

## 2019-01-14 MED ORDER — SODIUM CHLORIDE (PF) 0.9 % IJ SOLN
INTRAMUSCULAR | Status: AC
  Filled 2019-01-14: qty 50

## 2019-01-14 MED ORDER — FERROUS SULFATE 325 (65 FE) MG PO TABS: 325.0000 mg | ORAL_TABLET | Freq: Three times a day (TID) | ORAL | Status: DC

## 2019-01-14 MED ORDER — PROPOFOL 500 MG/50ML IV EMUL
INTRAVENOUS | Status: DC | PRN
  Administered 2019-01-14: 50 ug/kg/min via INTRAVENOUS

## 2019-01-14 MED ORDER — MAGNESIUM 250 MG PO TABS: 250.0000 mg | ORAL_TABLET | Freq: Every day | ORAL | Status: DC

## 2019-01-14 MED ORDER — SODIUM CHLORIDE 0.9% FLUSH
INTRAVENOUS | Status: DC | PRN
  Administered 2019-01-14: 20 mL

## 2019-01-14 MED ORDER — TRAMADOL HCL 50 MG PO TABS
50.0000 mg | ORAL_TABLET | Freq: Four times a day (QID) | ORAL | Status: DC
  Administered 2019-01-14 – 2019-01-15 (×5): 50 mg via ORAL
  Filled 2019-01-14 (×5): qty 1

## 2019-01-14 MED ORDER — ONDANSETRON HCL 4 MG/2ML IJ SOLN
INTRAMUSCULAR | Status: DC | PRN
  Administered 2019-01-14: 4 mg via INTRAVENOUS

## 2019-01-14 MED ORDER — SODIUM CHLORIDE 0.9 % IR SOLN
Status: DC | PRN
  Administered 2019-01-14: 1000 mL

## 2019-01-14 MED ORDER — FLEET ENEMA 7-19 GM/118ML RE ENEM: 1.0000 | ENEMA | Freq: Once | RECTAL | Status: DC | PRN

## 2019-01-14 MED ORDER — BUPIVACAINE LIPOSOME 1.3 % IJ SUSP
INTRAMUSCULAR | Status: DC | PRN
  Administered 2019-01-14: 20 mL

## 2019-01-14 MED ORDER — MAGNESIUM OXIDE 400 (241.3 MG) MG PO TABS
200.0000 mg | ORAL_TABLET | Freq: Every day | ORAL | Status: DC
  Administered 2019-01-15: 09:00:00 200 mg via ORAL
  Filled 2019-01-14: qty 1

## 2019-01-14 MED ORDER — BUPIVACAINE-EPINEPHRINE (PF) 0.25% -1:200000 IJ SOLN
INTRAMUSCULAR | Status: AC
  Filled 2019-01-14: qty 30

## 2019-01-14 MED ORDER — BUPIVACAINE-EPINEPHRINE 0.25% -1:200000 IJ SOLN
INTRAMUSCULAR | Status: DC | PRN
  Administered 2019-01-14: 30 mL

## 2019-01-14 MED ORDER — DIPHENHYDRAMINE HCL 12.5 MG/5ML PO ELIX: 12.5000 mg | ORAL_SOLUTION | ORAL | Status: DC | PRN

## 2019-01-14 MED ORDER — ROPIVACAINE HCL 7.5 MG/ML IJ SOLN
INTRAMUSCULAR | Status: DC | PRN
  Administered 2019-01-14: 20 mL via PERINEURAL

## 2019-01-14 MED ORDER — MIDAZOLAM HCL 2 MG/2ML IJ SOLN
INTRAMUSCULAR | Status: AC
  Filled 2019-01-14: qty 2

## 2019-01-14 MED ORDER — PANTOPRAZOLE SODIUM 40 MG PO TBEC
40.0000 mg | DELAYED_RELEASE_TABLET | Freq: Every day | ORAL | Status: DC
  Administered 2019-01-15: 40 mg via ORAL
  Filled 2019-01-14: qty 1

## 2019-01-14 MED ORDER — CEFAZOLIN SODIUM-DEXTROSE 2-4 GM/100ML-% IV SOLN
2.0000 g | Freq: Four times a day (QID) | INTRAVENOUS | Status: AC
  Administered 2019-01-14 (×2): 2 g via INTRAVENOUS
  Filled 2019-01-14 (×2): qty 100

## 2019-01-14 MED ORDER — POVIDONE-IODINE 10 % EX SWAB
2.0000 "application " | Freq: Once | CUTANEOUS | Status: AC
  Administered 2019-01-14: 2 via TOPICAL

## 2019-01-14 MED ORDER — FENTANYL CITRATE (PF) 100 MCG/2ML IJ SOLN: 25.0000 ug | INTRAMUSCULAR | Status: DC | PRN

## 2019-01-14 MED ORDER — HYDROMORPHONE HCL 1 MG/ML IJ SOLN: 0.5000 mg | INTRAMUSCULAR | Status: DC | PRN

## 2019-01-14 MED ORDER — METHOCARBAMOL 500 MG IVPB - SIMPLE MED
INTRAVENOUS | Status: AC
  Filled 2019-01-14: qty 50

## 2019-01-14 MED ORDER — METOCLOPRAMIDE HCL 5 MG PO TABS: 5.0000 mg | ORAL_TABLET | Freq: Three times a day (TID) | ORAL | Status: DC | PRN

## 2019-01-14 MED ORDER — SENNOSIDES-DOCUSATE SODIUM 8.6-50 MG PO TABS: 1.0000 | ORAL_TABLET | Freq: Every evening | ORAL | Status: DC | PRN

## 2019-01-14 MED ORDER — CEFAZOLIN SODIUM-DEXTROSE 2-4 GM/100ML-% IV SOLN
2.0000 g | INTRAVENOUS | Status: AC
  Administered 2019-01-14: 2 g via INTRAVENOUS
  Filled 2019-01-14: qty 100

## 2019-01-14 MED ORDER — ACETAMINOPHEN 500 MG PO TABS
1000.0000 mg | ORAL_TABLET | Freq: Four times a day (QID) | ORAL | Status: AC
  Administered 2019-01-14 – 2019-01-15 (×4): 1000 mg via ORAL
  Filled 2019-01-14 (×5): qty 2

## 2019-01-14 MED ORDER — VANCOMYCIN HCL IN DEXTROSE 1-5 GM/200ML-% IV SOLN
1000.0000 mg | INTRAVENOUS | Status: AC
  Administered 2019-01-14: 06:00:00 1000 mg via INTRAVENOUS
  Filled 2019-01-14: qty 200

## 2019-01-14 MED ORDER — MIDAZOLAM HCL 5 MG/5ML IJ SOLN
INTRAMUSCULAR | Status: DC | PRN
  Administered 2019-01-14: 1 mg via INTRAVENOUS

## 2019-01-14 MED ORDER — GLYBURIDE 2.5 MG PO TABS
2.5000 mg | ORAL_TABLET | Freq: Every day | ORAL | Status: DC
  Administered 2019-01-14 – 2019-01-15 (×2): 2.5 mg via ORAL
  Filled 2019-01-14 (×2): qty 1

## 2019-01-14 MED ORDER — TRANEXAMIC ACID-NACL 1000-0.7 MG/100ML-% IV SOLN
1000.0000 mg | Freq: Once | INTRAVENOUS | Status: AC
  Administered 2019-01-14: 11:00:00 1000 mg via INTRAVENOUS
  Filled 2019-01-14: qty 100

## 2019-01-14 SURGICAL SUPPLY — 62 items
ARTISURF 10M PLYL 10-11EF KNEE (Knees) ×2 IMPLANT
BAG SPEC THK2 15X12 ZIP CLS (MISCELLANEOUS) ×1
BAG ZIPLOCK 12X15 (MISCELLANEOUS) ×3 IMPLANT
BLADE SAGITTAL 13X1.27X60 (BLADE) ×2 IMPLANT
BLADE SAGITTAL 13X1.27X60MM (BLADE) ×1
BLADE SAW SGTL 83.5X18.5 (BLADE) ×3 IMPLANT
BLADE SURG 15 STRL LF DISP TIS (BLADE) ×1 IMPLANT
BLADE SURG 15 STRL SS (BLADE) ×3
BLADE SURG SZ10 CARB STEEL (BLADE) ×6 IMPLANT
BNDG CMPR MED 10X6 ELC LF (GAUZE/BANDAGES/DRESSINGS) ×1
BNDG ELASTIC 6X10 VLCR STRL LF (GAUZE/BANDAGES/DRESSINGS) ×2 IMPLANT
BNDG ELASTIC 6X5.8 VLCR STR LF (GAUZE/BANDAGES/DRESSINGS) ×3 IMPLANT
BOWL SMART MIX CTS (DISPOSABLE) ×3 IMPLANT
BSPLAT TIB 5D E CMNT STM LT (Knees) ×1 IMPLANT
CEMENT BONE SIMPLEX SPEEDSET (Cement) ×6 IMPLANT
CLOSURE WOUND 1/2 X4 (GAUZE/BANDAGES/DRESSINGS) ×2
COMP FEM PERSONA SZ10 LT (Joint) ×3 IMPLANT
COMPONENT FEM PERSONA SZ10 LT (Joint) IMPLANT
COVER SURGICAL LIGHT HANDLE (MISCELLANEOUS) ×3 IMPLANT
COVER WAND RF STERILE (DRAPES) IMPLANT
CUFF TOURN SGL QUICK 34 (TOURNIQUET CUFF) ×3
CUFF TRNQT CYL 34X4.125X (TOURNIQUET CUFF) ×1 IMPLANT
DECANTER SPIKE VIAL GLASS SM (MISCELLANEOUS) ×6 IMPLANT
DRAPE INCISE IOBAN 66X45 STRL (DRAPES) ×6 IMPLANT
DRAPE U-SHAPE 47X51 STRL (DRAPES) ×3 IMPLANT
DRILL PIN HEADLESS TROCAR 3X75 (PIN) ×2 IMPLANT
DRSG AQUACEL AG ADV 3.5X10 (GAUZE/BANDAGES/DRESSINGS) ×3 IMPLANT
DURAPREP 26ML APPLICATOR (WOUND CARE) ×6 IMPLANT
ELECT REM PT RETURN 15FT ADLT (MISCELLANEOUS) ×3 IMPLANT
GLOVE BIOGEL M STRL SZ7.5 (GLOVE) ×3 IMPLANT
GLOVE BIOGEL PI IND STRL 7.5 (GLOVE) ×1 IMPLANT
GLOVE BIOGEL PI IND STRL 8.5 (GLOVE) ×2 IMPLANT
GLOVE BIOGEL PI INDICATOR 7.5 (GLOVE) ×2
GLOVE BIOGEL PI INDICATOR 8.5 (GLOVE) ×4
GLOVE SURG ORTHO 8.0 STRL STRW (GLOVE) ×9 IMPLANT
GOWN STRL REUS W/ TWL XL LVL3 (GOWN DISPOSABLE) ×2 IMPLANT
GOWN STRL REUS W/TWL XL LVL3 (GOWN DISPOSABLE) ×6
HANDPIECE INTERPULSE COAX TIP (DISPOSABLE) ×3
HOLDER FOLEY CATH W/STRAP (MISCELLANEOUS) ×3 IMPLANT
HOOD PEEL AWAY FLYTE STAYCOOL (MISCELLANEOUS) ×9 IMPLANT
KIT TURNOVER KIT A (KITS) IMPLANT
MANIFOLD NEPTUNE II (INSTRUMENTS) ×3 IMPLANT
NEEDLE HYPO 22GX1.5 SAFETY (NEEDLE) ×3 IMPLANT
NS IRRIG 1000ML POUR BTL (IV SOLUTION) ×3 IMPLANT
PACK TOTAL KNEE CUSTOM (KITS) ×3 IMPLANT
PROTECTOR NERVE ULNAR (MISCELLANEOUS) ×3 IMPLANT
SET HNDPC FAN SPRY TIP SCT (DISPOSABLE) ×1 IMPLANT
STEM POLY PAT PLY 35M KNEE (Knees) ×2 IMPLANT
STEM TIBIA 5 DEG SZ E L KNEE (Knees) IMPLANT
STRIP CLOSURE SKIN 1/2X4 (GAUZE/BANDAGES/DRESSINGS) ×3 IMPLANT
SUT BONE WAX W31G (SUTURE) ×3 IMPLANT
SUT MNCRL AB 3-0 PS2 18 (SUTURE) ×3 IMPLANT
SUT STRATAFIX 0 PDS 27 VIOLET (SUTURE) ×3
SUT STRATAFIX PDS+ 0 24IN (SUTURE) ×3 IMPLANT
SUT VIC AB 1 CT1 36 (SUTURE) ×3 IMPLANT
SUTURE STRATFX 0 PDS 27 VIOLET (SUTURE) ×1 IMPLANT
SYR CONTROL 10ML LL (SYRINGE) ×6 IMPLANT
TIBIA STEM 5 DEG SZ E L KNEE (Knees) ×3 IMPLANT
TRAY FOLEY MTR SLVR 16FR STAT (SET/KITS/TRAYS/PACK) ×3 IMPLANT
WATER STERILE IRR 1000ML POUR (IV SOLUTION) ×6 IMPLANT
WRAP KNEE MAXI GEL POST OP (GAUZE/BANDAGES/DRESSINGS) ×3 IMPLANT
YANKAUER SUCT BULB TIP 10FT TU (MISCELLANEOUS) ×3 IMPLANT

## 2019-01-14 NOTE — Evaluation (Signed)
Physical Therapy Evaluation Patient Details Name: Timothy Farrell MRN: [id number] DOB: 11/06/1935 Today's Date: 01/14/2019   History of Present Illness  83 yo male s/p L TKA 01/14/19  Clinical Impression  On eval POD 0, pt was Min guard assist for mobility. He walked ~75 feet with a RW. Minimal pain with activity. D/c plan is for home with HHPT.     Follow Up Recommendations Follow surgeon's recommendation for DC plan and follow-up therapies(HHPT)    Equipment Recommendations  None recommended by PT    Recommendations for Other Services       Precautions / Restrictions Precautions Precautions: Knee;Fall Restrictions Weight Bearing Restrictions: No Other Position/Activity Restrictions: WBAT      Mobility  Bed Mobility Overal bed mobility: Needs Assistance Bed Mobility: Supine to Sit     Supine to sit: Min guard;HOB elevated     General bed mobility comments: close guard for safety.  Transfers Overall transfer level: Needs assistance Equipment used: Rolling walker (2 wheeled) Transfers: Sit to/from Stand Sit to Stand: Min guard;From elevated surface         General transfer comment: close guard for safety. vcs safety, hand placement.  Ambulation/Gait Ambulation/Gait assistance: Min guard Gait Distance (Feet): 75 Feet Assistive device: Rolling walker (2 wheeled) Gait Pattern/deviations: Step-to pattern     General Gait Details: close guard for safety.  Stairs            Wheelchair Mobility    Modified Rankin (Stroke Patients Only)       Balance Overall balance assessment: Mild deficits observed, not formally tested                                           Pertinent Vitals/Pain Pain Assessment: 0-10 Pain Score: 5  Pain Location: L knee Pain Descriptors / Indicators: Aching;Sore Pain Intervention(s): Monitored during session;Ice applied    Home Living Family/patient expects to be discharged to:: Private residence Living  Arrangements: Spouse/significant other Available Help at Discharge: Family Type of Home: House Home Access: Stairs to enter Entrance Stairs-Rails: Right Entrance Stairs-Number of Steps: 3 Home Layout: One level;Laundry or work area in [place]: Environmental consultant - 2 wheels;Cane - single point      Prior Function Level of Independence: Independent               Hand Dominance        Extremity/Trunk Assessment   Upper Extremity Assessment Upper Extremity Assessment: Overall WFL for tasks assessed    Lower Extremity Assessment Lower Extremity Assessment: (post op weakness 2* TKA)       Communication   Communication: No difficulties  Cognition Arousal/Alertness: Awake/alert Behavior During Therapy: WFL for tasks assessed/performed Overall Cognitive Status: Within Functional Limits for tasks assessed                                        General Comments      Exercises     Assessment/Plan    PT Assessment Patient needs continued PT services  PT Problem List Decreased strength;Decreased mobility;Decreased range of motion;Decreased activity tolerance;Decreased balance;Decreased knowledge of use of DME;Pain       PT Treatment Interventions DME instruction;Gait training;Stair training;Functional mobility training;Balance training;Therapeutic exercise;Patient/family education;Therapeutic activities    PT Goals (Current goals can be found  in the Care Plan section)  Acute Rehab PT Goals Patient Stated Goal: regain PLOF/independence PT Goal Formulation: With patient Time For Goal Achievement: 01/28/19 Potential to Achieve Goals: Good    Frequency 7X/week   Barriers to discharge        Co-evaluation               AM-PAC PT "6 Clicks" Mobility  Outcome Measure Help needed turning from your back to your side while in a flat bed without using bedrails?: A Little Help needed moving from lying on your back to sitting on the side of a  flat bed without using bedrails?: A Little Help needed moving to and from a bed to a chair (including a wheelchair)?: A Little Help needed standing up from a chair using your arms (e.g., wheelchair or bedside chair)?: A Little Help needed to walk in hospital room?: A Little Help needed climbing 3-5 steps with a railing? : A Little 6 Click Score: 18    End of Session Equipment Utilized During Treatment: Gait belt Activity Tolerance: Patient tolerated treatment well Patient left: in chair;with call bell/phone within reach   PT Visit Diagnosis: Other abnormalities of gait and mobility (R26.89)    Time: 5277-8242 PT Time Calculation (min) (ACUTE ONLY): 17 min   Charges:   PT Evaluation $PT Eval Low Complexity: [place], PT Acute Rehabilitation Services Pager: [phone] Office: [phone]

## 2019-01-14 NOTE — Transfer of Care (Signed)
Immediate Anesthesia Transfer of Care Note  Patient: SEITH AIKEY  Procedure(s) Performed: TOTAL KNEE ARTHROPLASTY (Left Knee)  Patient Location: PACU  Anesthesia Type:Spinal  Level of Consciousness: awake, alert , oriented and patient cooperative  Airway & Oxygen Therapy: Patient Spontanous Breathing and Patient connected to face mask oxygen  Post-op Assessment: Report given to RN and Post -op Vital signs reviewed and stable  Post vital signs: Reviewed and stable  Last Vitals:  Vitals Value Taken Time  BP 121/81 01/14/19 0904  Temp    Pulse 59 01/14/19 0907  Resp 16 01/14/19 0907  SpO2 100 % 01/14/19 0907  Vitals shown include unvalidated device data.  Last Pain:  Vitals:   01/14/19 0626  TempSrc: Oral  PainSc:          Complications: No apparent anesthesia complications

## 2019-01-14 NOTE — H&P (Signed)
Timothy Farrell MRN:  [id number] DOB/SEX:  12-16-1935/male  CHIEF COMPLAINT:  Painful left Knee  HISTORY: Patient is a 83 y.o. male presented with a history of pain in the left knee. Onset of symptoms was gradual starting a few years ago with gradually worsening course since that time. Patient has been treated conservatively with over-the-counter NSAIDs and activity modification. Patient currently rates pain in the knee at 10 out of 10 with activity. There is pain at night.  PAST MEDICAL HISTORY: There are no active problems to display for this patient.  Past Medical History:  Diagnosis Date  . Arthritis   . Diabetes mellitus without complication ([place])   . GERD (gastroesophageal reflux disease)   . Hypertension    Past Surgical History:  Procedure Laterality Date  . COLONOSCOPY    . FRACTURE SURGERY Left 2005   plate and screws  . TONSILLECTOMY    . VASCULAR SURGERY  1960   rt leg     MEDICATIONS:   Medications Prior to Admission  Medication Sig Dispense Refill Last Dose  . acetaminophen (TYLENOL) 500 MG tablet Take 500-1,000 mg by mouth every 6 (six) hours as needed for moderate pain or headache.   01/11/2019  . amLODipine (NORVASC) 5 MG tablet Take 5 mg by mouth daily.   01/14/2019 at [place]  . B COMPLEX-ZINC PO Take 1 tablet by mouth daily at 12 noon.   01/13/2019  . CALCIUM CARBONATE-VITAMIN D PO Take 1 tablet by mouth daily at 12 noon.   01/13/2019  . Calcium Citrate-Vitamin D (CALCIUM CITRATE +D PO) Take 1 tablet by mouth at bedtime.   01/13/2019  . carvedilol (COREG) 12.5 MG tablet Take 12.5 mg by mouth 2 (two) times daily with a meal.   01/14/2019 at 0345  . COD LIVER OIL PO Take 1 capsule by mouth daily at 12 noon.   01/13/2019  . glyBURIDE (DIABETA) 2.5 MG tablet Take 2.5 mg by mouth daily at 12 noon.   01/13/2019 at 1200  . lisinopril (ZESTRIL) 20 MG tablet Take 10 mg by mouth at bedtime.   01/13/2019 at 2000  . lovastatin (MEVACOR) 40 MG tablet Take 40 mg by mouth at bedtime.    01/13/2019 at 2000  . Magnesium 250 MG TABS Take 250 mg by mouth daily.   01/13/2019  . meloxicam (MOBIC) 15 MG tablet Take 15 mg by mouth daily as needed for pain.   Past Month at Unknown time  . metFORMIN (GLUCOPHAGE) 1000 MG tablet Take 1,000 mg by mouth 2 (two) times a day.   01/13/2019 at 1800  . Multiple Vitamins-Minerals (CENTRUM SILVER 50+MEN PO) Take 1 tablet by mouth daily.   01/13/2019  . omeprazole (PRILOSEC OTC) 20 MG tablet Take 20 mg by mouth daily.   01/14/2019 at [place]  . Probiotic CAPS Take 1 capsule by mouth daily.   01/13/2019  . vitamin C (ASCORBIC ACID) 500 MG tablet Take 500 mg by mouth daily at 12 noon.   01/13/2019  . Alpha Lipoic Acid 200 MG CAPS Take 200 mg by mouth at bedtime.     Marland Kitchen aspirin EC 81 MG tablet Take 81 mg by mouth at bedtime.   01/07/2019    ALLERGIES:  No Known Allergies  REVIEW OF SYSTEMS:  A comprehensive review of systems was negative except for: Musculoskeletal: positive for arthralgias and bone pain   FAMILY HISTORY:  History reviewed. No pertinent family history.  SOCIAL HISTORY:   Social History   Tobacco Use  .  Smoking status: Never Smoker  . Smokeless tobacco: Never Used  Substance Use Topics  . Alcohol use: Never    Frequency: Never     EXAMINATION:  Vital signs in last 24 hours:    There were no vitals taken for this visit.  General Appearance:    Alert, cooperative, no distress, appears stated age  Head:    Normocephalic, without obvious abnormality, atraumatic  Eyes:    PERRL, conjunctiva/corneas clear, EOM's intact, fundi    benign, both eyes       Ears:    Normal TM's and external ear canals, both ears  Nose:   Nares normal, septum midline, mucosa normal, no drainage    or sinus tenderness  Throat:   Lips, mucosa, and tongue normal; teeth and gums normal  Neck:   Supple, symmetrical, trachea midline, no adenopathy;       thyroid:  No enlargement/tenderness/nodules; no carotid   bruit or JVD  Back:     Symmetric, no curvature, ROM  normal, no CVA tenderness  Lungs:     Clear to auscultation bilaterally, respirations unlabored  Chest wall:    No tenderness or deformity  Heart:    Regular rate and rhythm, S1 and S2 normal, no murmur, rub   or gallop  Abdomen:     Soft, non-tender, bowel sounds active all four quadrants,    no masses, no organomegaly  Genitalia:    Normal male without lesion, discharge or tenderness  Rectal:    Normal tone, normal prostate, no masses or tenderness;   guaiac negative stool  Extremities:   Extremities normal, atraumatic, no cyanosis or edema  Pulses:   2+ and symmetric all extremities  Skin:   Skin color, texture, turgor normal, no rashes or lesions  Lymph nodes:   Cervical, supraclavicular, and axillary nodes normal  Neurologic:   CNII-XII intact. Normal strength, sensation and reflexes      throughout    Musculoskeletal:  ROM 0-120, Ligaments intact,  Imaging Review Plain radiographs demonstrate severe degenerative joint disease of the left knee. The overall alignment is neutral. The bone quality appears to be good for age and reported activity level.  Assessment/Plan: Primary osteoarthritis, left knee   The patient history, physical examination and imaging studies are consistent with advanced degenerative joint disease of the left knee. The patient has failed conservative treatment.  The clearance notes were reviewed.  After discussion with the patient it was felt that Total Knee Replacement was indicated. The procedure,  risks, and benefits of total knee arthroplasty were presented and reviewed. The risks including but not limited to aseptic loosening, infection, blood clots, vascular injury, stiffness, patella tracking problems complications among others were discussed. The patient acknowledged the explanation, agreed to proceed with the plan.  Preoperative templating of the joint replacement has been completed, documented, and submitted to the Operating Room personnel in order to  optimize intra-operative equipment management.    Patient's anticipated LOS is less than 2 midnights, meeting these requirements: - Lives within 1 hour of care - Has a competent adult at home to recover with post-op recover - NO history of  - Chronic pain requiring opiods  - Diabetes  - Coronary Artery Disease  - Heart failure  - Heart attack  - Stroke  - DVT/VTE  - Cardiac arrhythmia  - Respiratory Failure/COPD  - Renal failure  - Anemia  - Advanced Liver disease       Donia Ast 01/14/2019, 6:12 AM

## 2019-01-14 NOTE — Anesthesia Procedure Notes (Signed)
Anesthesia Regional Block: Adductor canal block   Pre-Anesthetic Checklist: ,, timeout performed, Correct Patient, Correct Site, Correct Laterality, Correct Procedure, Correct Position, site marked, Risks and benefits discussed,  Surgical consent,  Pre-op evaluation,  At surgeon's request and post-op pain management  Laterality: Left  Prep: chloraprep       Needles:  Injection technique: Single-shot  Needle Type: Echogenic Needle     Needle Length: 9cm  Needle Gauge: 21     Additional Needles:   Procedures:,,,, ultrasound used (permanent image in chart),,,,  Narrative:  Start time: 01/14/2019 6:50 AM End time: 01/14/2019 6:59 AM Injection made incrementally with aspirations every 5 mL.  Performed by: Personally  Anesthesiologist: Catalina Gravel, MD  Additional Notes: No pain on injection. No increased resistance to injection. Injection made in 5cc increments.  Good needle visualization.  Patient tolerated procedure well.

## 2019-01-14 NOTE — Op Note (Signed)
TOTAL KNEE REPLACEMENT OPERATIVE NOTE:  01/14/2019  11:21 AM  PATIENT:  Timothy Farrell  83 y.o. male  PRE-OPERATIVE DIAGNOSIS:  Lt. Knee Osteoarthritis  POST-OPERATIVE DIAGNOSIS:  Lt. Knee Osteoarthritis  PROCEDURE:  Procedure(s): TOTAL KNEE ARTHROPLASTY  SURGEON:  Surgeon(s): Vickey Huger, MD  PHYSICIAN ASSISTANT:  Carlyon Shadow PA-C  ANESTHESIA:   spinal  SPECIMEN: None  COUNTS:  Correct  TOURNIQUET:   Total Tourniquet Time Documented: Thigh (Left) - 42 minutes Total: Thigh (Left) - 42 minutes   DICTATION:  Indication for procedure:    The patient is a 83 y.o. male who has failed conservative treatment for Lt. Knee Osteoarthritis.  Informed consent was obtained prior to anesthesia. The risks versus benefits of the operation were explain and in a way the patient can, and did, understand.     Description of procedure:     The patient was taken to the operating room and placed under anesthesia.  The patient was positioned in the usual fashion taking care that all body parts were adequately padded and/or protected.  A tourniquet was applied and the leg prepped and draped in the usual sterile fashion.  The extremity was exsanguinated with the esmarch and tourniquet inflated to 350 mmHg.  Pre-operative range of motion was normal.   A midline incision approximately 6-7 inches long was made with a #10 blade.  A new blade was used to make a parapatellar arthrotomy going 2-3 cm into the quadriceps tendon, over the patella, and alongside the medial aspect of the patellar tendon.  A synovectomy was then performed with the #10 blade and forceps. I then elevated the deep MCL off the medial tibial metaphysis subperiosteally around to the semimembranosus attachment.    I everted the patella and used calipers to measure patellar thickness.  I used the reamer to ream down to appropriate thickness to recreate the native thickness.  I then removed excess bone with the rongeur and sagittal  saw.  I used the appropriately sized template and drilled the three lug holes.  I then put the trial in place and measured the thickness with the calipers to ensure recreation of the native thickness.  The trial was then removed and the patella subluxed and the knee brought into flexion.  A homan retractor was place to retract and protect the patella and lateral structures.  A Z-retractor was place medially to protect the medial structures.  The extra-medullary alignment system was used to make cut the tibial articular surface perpendicular to the anamotic axis of the tibia and in 3 degrees of posterior slope.  The cut surface and alignment jig was removed.  I then used the intramedullary alignment guide to make a  valgus cut on the distal femur.  I then marked out the epicondylar axis on the distal femur. I then used the anterior referencing sizer and measured the femur to be a size 10.  The 4-In-1 cutting block was screwed into place in external rotation matching the posterior condylar angle, making our cuts perpendicular to the epicondylar axis.  Anterior, posterior and chamfer cuts were made with the sagittal saw.  The cutting block and cut pieces were removed.  A lamina spreader was placed in 90 degrees of flexion.  The ACL, PCL, menisci, and posterior condylar osteophytes were removed.  A 10 mm spacer blocked was found to offer good flexion and extension gap balance after minimal in degree releasing.   The scoop retractor was then placed and the femoral finishing block was  pinned in place.  The small sagittal saw was used as well as the lug drill to finish the femur.  The block and cut surfaces were removed and the medullary canal hole filled with autograft bone from the cut pieces.  The tibia was delivered forward in deep flexion and external rotation.  A size E tray was selected and pinned into place centered on the medial 1/3 of the tibial tubercle.  The reamer and keel was used to prepare the tibia  through the tray.    I then trialed with the size 10 femur, size E tibia, a 10 mm insert and the 35 patella.  I had excellent flexion/extension gap balance, excellent patella tracking.  Flexion was full and beyond 120 degrees; extension was zero.  These components were chosen and the staff opened them to me on the back table while the knee was lavaged copiously and the cement mixed.  The soft tissue was infiltrated with 60cc of exparel 1.3% through a 21 gauge needle.  I cemented in the components and removed all excess cement.  The polyethylene tibial component was snapped into place and the knee placed in extension while cement was hardening.  The capsule was infilltrated with a 60cc exparel/marcaine/saline mixture.   Once the cement was hard, the tourniquet was let down.  Hemostasis was obtained.  The arthrotomy was closed using a #1 stratofix running suture.  The deep soft tissues were closed with #0 vicryls and the subcuticular layer closed with #2-0 vicryl.  The skin was reapproximated and closed with 3.0 Monocryl.  The wound was covered with steristrips, aquacel dressing, and a TED stocking.   The patient was then awakened, extubated, and taken to the recovery room in stable condition.  BLOOD LOSS:  482LM COMPLICATIONS:  None.  PLAN OF CARE: Admit for overnight observation  PATIENT DISPOSITION:  PACU - hemodynamically stable.    Please fax a copy of this op note to my office at [phone] (please only include page 1 and 2 of the Case Information op note)

## 2019-01-14 NOTE — Anesthesia Postprocedure Evaluation (Signed)
Anesthesia Post Note  Patient: Timothy Farrell  Procedure(s) Performed: TOTAL KNEE ARTHROPLASTY (Left Knee)     Patient location during evaluation: PACU Anesthesia Type: Spinal Level of consciousness: oriented, awake and alert and awake Pain management: pain level controlled Vital Signs Assessment: post-procedure vital signs reviewed and stable Respiratory status: spontaneous breathing, respiratory function stable, patient connected to nasal cannula oxygen and nonlabored ventilation Cardiovascular status: blood pressure returned to baseline and stable Postop Assessment: no headache, no backache, no apparent nausea or vomiting, patient able to bend at knees and spinal receding Anesthetic complications: no    Last Vitals:  Vitals:   01/14/19 0945 01/14/19 1005  BP: 132/72 135/70  Pulse: 62 62  Resp: 17 15  Temp:  36.5 C  SpO2: 98% 96%    Last Pain:  Vitals:   01/14/19 1005  TempSrc: Axillary  PainSc: [place]

## 2019-01-14 NOTE — Anesthesia Procedure Notes (Signed)
Spinal  Patient location during procedure: OR Start time: 01/14/2019 7:27 AM End time: 01/14/2019 7:35 AM Staffing Anesthesiologist: Catalina Gravel, MD Performed: anesthesiologist  Preanesthetic Checklist Completed: patient identified, surgical consent, pre-op evaluation, timeout performed, IV checked, risks and benefits discussed and monitors and equipment checked Spinal Block Patient position: sitting Prep: site prepped and draped and DuraPrep Patient monitoring: continuous pulse ox and blood pressure Approach: midline Location: L3-4 Injection technique: single-shot Needle Needle type: Pencan  Needle gauge: 24 G Additional Notes Functioning IV was confirmed and monitors were applied. Sterile prep and drape, including hand hygiene, mask and sterile gloves were used. The patient was positioned and the spine was prepped. The skin was anesthetized with lidocaine.  Free flow of clear CSF was obtained prior to injecting local anesthetic into the CSF.  The spinal needle aspirated freely following injection.  The needle was carefully withdrawn.  The patient tolerated the procedure well. Consent was obtained prior to procedure with all questions answered and concerns addressed. Risks including but not limited to bleeding, infection, nerve damage, paralysis, failed block, inadequate analgesia, allergic reaction, high spinal, itching and headache were discussed and the patient wished to proceed.   **Attempt x2 by CRNA at 2 levels, unable to get CSF return.  Attempt x1 by MDA with +CSF return.  Hoy Morn, MD

## 2019-01-15 ENCOUNTER — Encounter (HOSPITAL_COMMUNITY): Payer: Self-pay | Admitting: Orthopedic Surgery

## 2019-01-15 DIAGNOSIS — Z96652 Presence of left artificial knee joint: Secondary | ICD-10-CM | POA: Diagnosis not present

## 2019-01-15 DIAGNOSIS — M1712 Unilateral primary osteoarthritis, left knee: Secondary | ICD-10-CM | POA: Diagnosis not present

## 2019-01-15 LAB — CBC
HCT: 36.9 % — ABNORMAL LOW (ref 39.0–52.0)
Hemoglobin: 12.1 g/dL — ABNORMAL LOW (ref 13.0–17.0)
MCH: 31.7 pg (ref 26.0–34.0)
MCHC: 32.8 g/dL (ref 30.0–36.0)
MCV: 96.6 fL (ref 80.0–100.0)
Platelets: 187 10*3/uL (ref 150–400)
RBC: 3.82 MIL/uL — ABNORMAL LOW (ref 4.22–5.81)
RDW: 12.6 % (ref 11.5–15.5)
WBC: 13.8 10*3/uL — ABNORMAL HIGH (ref 4.0–10.5)
nRBC: 0 % (ref 0.0–0.2)

## 2019-01-15 LAB — BASIC METABOLIC PANEL
Anion gap: 10 (ref 5–15)
BUN: 18 mg/dL (ref 8–23)
CO2: 23 mmol/L (ref 22–32)
Calcium: 8.7 mg/dL — ABNORMAL LOW (ref 8.9–10.3)
Chloride: 101 mmol/L (ref 98–111)
Creatinine, Ser: 0.72 mg/dL (ref 0.61–1.24)
GFR calc Af Amer: >60 mL/min (ref >60)
GFR calc non Af Amer: >60 mL/min (ref >60)
Glucose, Bld: 148 mg/dL — ABNORMAL HIGH (ref 70–99)
Potassium: 3.9 mmol/L (ref 3.5–5.1)
Sodium: 134 mmol/L — ABNORMAL LOW (ref 135–145)

## 2019-01-15 LAB — GLUCOSE, CAPILLARY
Glucose-Capillary: 130 mg/dL — ABNORMAL HIGH (ref 70–99)
Glucose-Capillary: 146 mg/dL — ABNORMAL HIGH (ref 70–99)

## 2019-01-15 MED ORDER — OXYCODONE HCL 5 MG PO TABS: 5.0000 mg | ORAL_TABLET | Freq: Four times a day (QID) | ORAL | 0 refills | Status: DC | PRN

## 2019-01-15 MED ORDER — METHOCARBAMOL 500 MG PO TABS: 500.0000 mg | ORAL_TABLET | Freq: Four times a day (QID) | ORAL | 0 refills | Status: AC | PRN

## 2019-01-15 MED ORDER — ASPIRIN 325 MG PO TBEC: 325.0000 mg | DELAYED_RELEASE_TABLET | Freq: Two times a day (BID) | ORAL | 0 refills | Status: DC

## 2019-01-15 NOTE — TOC Transition Note (Signed)
Transition of Care [facility]) - CM/SW Discharge Note   Patient Details  Name: Timothy Farrell MRN: [id number] Date of Birth: 11/05/1935  [facility] CM/SW Contact:  Leeroy Cha, RN Phone Number: 01/15/2019, 12:49 PM   Clinical Narrative:    hhc pt through [facility]   Final next level of care: [place] Barriers to Discharge: No Barriers Identified   Patient Goals and CMS Choice        Discharge Placement                       Discharge Plan and Services   Discharge Planning Services: CM Consult Post Acute Care Choice: Home Health, Durable Medical Equipment          DME Arranged: Walker rolling, 3-N-1 DME Agency: [employer] Date DME Agency Contacted: 01/15/19 Time DME Agency Contacted: 0900 Representative spoke with at DME Agency: nathan HH Arranged: PT [place]: [place] ([place]) Date [place]: 01/15/19 Time [place]: 1655 Representative spoke with at [place]: [place] ([place]) Interventions     Readmission Risk Interventions No flowsheet data found.

## 2019-01-15 NOTE — Progress Notes (Signed)
SPORTS MEDICINE AND JOINT REPLACEMENT  Lara Mulch, MD    Carlyon Shadow, PA-C [place], [place],   [address]                             [phone]   PROGRESS NOTE  Subjective:  negative for Chest Pain  negative for Shortness of Breath  negative for Nausea/Vomiting   negative for Calf Pain  negative for Bowel Movement   Tolerating Diet: yes         Patient reports pain as 3 on 0-10 scale.    Objective: Vital signs in last 24 hours:    Patient Vitals for the past 24 hrs:  BP Temp Temp src Pulse Resp SpO2 Height Weight  01/15/19 0535 128/79 (!) 97.5 F (36.4 C) - 66 18 99 % - -  01/15/19 0201 130/73 97.6 F (36.4 C) - 67 18 100 % - -  01/14/19 2204 137/74 97.6 F (36.4 C) Oral 77 18 99 % - -  01/14/19 1800 137/80 (!) 97.5 F (36.4 C) Oral 78 16 98 % - -  01/14/19 1313 132/89 (!) 97.5 F (36.4 C) Oral 73 15 100 % - -  01/14/19 1222 135/72 97.6 F (36.4 C) Oral 67 15 99 % - -  01/14/19 1104 133/70 97.6 F (36.4 C) Oral 65 15 98 % - -  01/14/19 1011 - - - - - - 5\' 11"  (1.803 m) 88.5 kg  01/14/19 1005 135/70 97.7 F (36.5 C) Axillary 62 15 96 % - -  01/14/19 0945 132/72 - - 62 17 98 % - -  01/14/19 0930 140/73 - - 61 15 100 % - -  01/14/19 0915 140/76 - - (!) 59 12 100 % - -  01/14/19 0904 121/81 97.6 F (36.4 C) - (!) 59 14 100 % - -    @flow {1959:LAST@   Intake/Output from previous day:   08/03 0701 - 08/04 0700 In: 3513 [P.O.:1060; I.V.:2303] Out: 5970 [Urine:5950]   Intake/Output this shift:   No intake/output data recorded.   Intake/Output      08/03 0701 - 08/04 0700 08/04 0701 - 08/05 0700   P.O. 1060    I.V. (mL/kg) 2303 (26)    IV Piggyback 150    Total Intake(mL/kg) 3513 (39.7)    Urine (mL/kg/hr) 5950 (2.8)    Stool 0    Blood 20    Total Output 5970    Net -2457            LABORATORY DATA: Recent Labs    01/10/19 1355 01/15/19 0305  WBC 6.4 13.8*  HGB 12.7* 12.1*  HCT 38.5* 36.9*  PLT 201 187   Recent Labs    01/10/19 1355 01/15/19 0305  NA 136 134*  K 4.7 3.9  CL 101 101  CO2 26 23  BUN 16 18  CREATININE 0.81 0.72  GLUCOSE 103* 148*  CALCIUM 9.3 8.7*   No results found for: INR, PROTIME  Examination:  General appearance: alert, cooperative and no distress Extremities: extremities normal, atraumatic, no cyanosis or edema  Wound Exam: clean, dry, intact   Drainage:  None: wound tissue dry  Motor Exam: Quadriceps and Hamstrings Intact  Sensory Exam: Superficial Peroneal, Deep Peroneal and Tibial normal   Assessment:    1 Day Post-Op  Procedure(s) (LRB): TOTAL KNEE ARTHROPLASTY (Left)  ADDITIONAL DIAGNOSIS:  Active Problems:   S/P total knee replacement  Plan: Physical Therapy as ordered Weight Bearing as Tolerated (WBAT)  DVT Prophylaxis:  Aspirin  DISCHARGE PLAN: Home        Patient's anticipated LOS is less than 2 midnights, meeting these requirements: - Lives within 1 hour of care - Has a competent adult at home to recover with post-op recover - NO history of  - Chronic pain requiring opiods  - Diabetes  - Coronary Artery Disease  - Heart failure  - Heart attack  - Stroke  - DVT/VTE  - Cardiac arrhythmia  - Respiratory Failure/COPD  - Renal failure  - Anemia  - Advanced Liver disease        Donia Ast 01/15/2019, 7:41 AM

## 2019-01-15 NOTE — Progress Notes (Signed)
Physical Therapy Treatment Patient Details Name: Timothy Farrell MRN: [id number] DOB: Jun 10, 1936 Today's Date: 01/15/2019    History of Present Illness 83 yo male s/p L TKA 01/14/19    PT Comments    Progressing well with mobility. Reviewed/practiced exercises, gait training, and stair training. All education completed. Okay to d/c from PT standpoint.    Follow Up Recommendations  Follow surgeon's recommendation for DC plan and follow-up therapies     Equipment Recommendations  None recommended by PT    Recommendations for Other Services       Precautions / Restrictions Precautions Precautions: Knee;Fall Restrictions Weight Bearing Restrictions: No Other Position/Activity Restrictions: WBAT    Mobility  Bed Mobility               General bed mobility comments: oob in recliner  Transfers Overall transfer level: Needs assistance Equipment used: Rolling walker (2 wheeled) Transfers: Sit to/from Stand Sit to Stand: Min guard         General transfer comment: close guard for safety. Vcs safety, hand placement.  Ambulation/Gait Ambulation/Gait assistance: Min guard Gait Distance (Feet): 150 Feet Assistive device: Rolling walker (2 wheeled) Gait Pattern/deviations: Step-through pattern;Decreased stride length     General Gait Details: close guard for safety. cues for safety, proper use of RW   Stairs Stairs: Yes Stairs assistance: Min assist Stair Management: Forwards;Step to pattern Number of Stairs: 3 General stair comments: VCs safety, technique, sequence. Provided 1 HHA on one side and pt held on to railing on other side.   Wheelchair Mobility    Modified Rankin (Stroke Patients Only)       Balance Overall balance assessment: Mild deficits observed, not formally tested                                          Cognition Arousal/Alertness: Awake/alert Behavior During Therapy: WFL for tasks assessed/performed Overall Cognitive  Status: Within Functional Limits for tasks assessed                                        Exercises Total Joint Exercises Ankle Circles/Pumps: AROM;Both;10 reps;Seated Quad Sets: AROM;Both;10 reps;Seated Straight Leg Raises: AROM;Left;10 reps;Supine Long Arc Quad: AROM;Left;10 reps;Seated Knee Flexion: AROM;Left;10 reps;Seated Goniometric ROM: ~5-100 degrees    General Comments        Pertinent Vitals/Pain Pain Assessment: 0-10 Pain Score: 6  Pain Location: L knee Pain Descriptors / Indicators: Aching;Sore Pain Intervention(s): Monitored during session;Ice applied    Home Living                      Prior Function            PT Goals (current goals can now be found in the care plan section) Progress towards PT goals: Progressing toward goals    Frequency    7X/week      PT Plan Current plan remains appropriate    Co-evaluation              AM-PAC PT "6 Clicks" Mobility   Outcome Measure  Help needed turning from your back to your side while in a flat bed without using bedrails?: A Little Help needed moving from lying on your back to sitting on the side of a flat bed without using  bedrails?: A Little Help needed moving to and from a bed to a chair (including a wheelchair)?: A Little Help needed standing up from a chair using your arms (e.g., wheelchair or bedside chair)?: A Little Help needed to walk in hospital room?: A Little Help needed climbing 3-5 steps with a railing? : A Little 6 Click Score: 18    End of Session Equipment Utilized During Treatment: Gait belt Activity Tolerance: Patient tolerated treatment well Patient left: in chair;with call bell/phone within reach   PT Visit Diagnosis: Other abnormalities of gait and mobility (R26.89)     Time: 1314-3888 PT Time Calculation (min) (ACUTE ONLY): 15 min  Charges:  $Gait Training: 8-22 mins                       Weston Anna, [place] Pager: [phone] Office: [phone]

## 2019-01-15 NOTE — Discharge Summary (Signed)
SPORTS MEDICINE & JOINT REPLACEMENT   Lara Mulch, MD   Carlyon Shadow, PA-C [place], [place], [place]  [address]                             [phone]  PATIENT ID: Timothy Farrell        MRN:  [id number]          DOB/AGE: 1936/03/12 / 83 y.o.    DISCHARGE SUMMARY  ADMISSION DATE:    01/14/2019 DISCHARGE DATE:   01/15/2019   ADMISSION DIAGNOSIS: Lt. Knee Osteoarthritis    DISCHARGE DIAGNOSIS:  Lt. Knee Osteoarthritis    ADDITIONAL DIAGNOSIS: Active Problems:   S/P total knee replacement  Past Medical History:  Diagnosis Date  . Arthritis   . Diabetes mellitus without complication ([place])   . GERD (gastroesophageal reflux disease)   . Hypertension     PROCEDURE: Procedure(s): TOTAL KNEE ARTHROPLASTY on 01/14/2019  CONSULTS:    HISTORY:  See H&P in chart  HOSPITAL COURSE:  Timothy Farrell is a 83 y.o. admitted on 01/14/2019 and found to have a diagnosis of Lt. Knee Osteoarthritis.  After appropriate laboratory studies were obtained  they were taken to the operating room on 01/14/2019 and underwent Procedure(s): TOTAL KNEE ARTHROPLASTY.   They were given perioperative antibiotics:  Anti-infectives (From admission, onward)   Start     Dose/Rate Route Frequency Ordered Stop   01/14/19 1400  ceFAZolin (ANCEF) IVPB 2g/100 mL premix     2 g 200 mL/hr over 30 Minutes Intravenous Every 6 hours 01/14/19 1002 01/14/19 2022   01/14/19 0600  vancomycin (VANCOCIN) IVPB 1000 mg/200 mL premix     1,000 mg 200 mL/hr over 60 Minutes Intravenous 60 min pre-op 01/14/19 0536 01/14/19 0714   01/14/19 0600  ceFAZolin (ANCEF) IVPB 2g/100 mL premix     2 g 200 mL/hr over 30 Minutes Intravenous On call to O.R. 01/14/19 1517 01/14/19 0732    .  Patient given tranexamic acid IV or topical and exparel intra-operatively.  Tolerated the procedure well.    POD# 1: Vital signs were stable.  Patient denied Chest pain, shortness of breath, or calf pain.  Patient was started on Aspirin twice  daily at 8am.  Consults to PT, OT, and care management were made.  The patient was weight bearing as tolerated.  CPM was placed on the operative leg 0-90 degrees for 6-8 hours a day. When out of the CPM, patient was placed in the foam block to achieve full extension. Incentive spirometry was taught.  Dressing was changed.       POD #2, Continued  PT for ambulation and exercise program.  IV saline locked.  O2 discontinued.    The remainder of the hospital course was dedicated to ambulation and strengthening.   The patient was discharged on 1 Day Post-Op in  Good condition.  Blood products given:none  DIAGNOSTIC STUDIES: Recent vital signs:  Patient Vitals for the past 24 hrs:  BP Temp Temp src Pulse Resp SpO2  01/15/19 0915 (!) 144/72 - - 70 15 99 %  01/15/19 0535 128/79 (!) 97.5 F (36.4 C) - 66 18 99 %  01/15/19 0201 130/73 97.6 F (36.4 C) - 67 18 100 %  01/14/19 2204 137/74 97.6 F (36.4 C) Oral 77 18 99 %  01/14/19 1800 137/80 (!) 97.5 F (36.4 C) Oral 78 16 98 %  01/14/19 1313 132/89 (!) 97.5  F (36.4 C) Oral 73 15 100 %       Recent laboratory studies: Recent Labs    01/10/19 1355 01/15/19 0305  WBC 6.4 13.8*  HGB 12.7* 12.1*  HCT 38.5* 36.9*  PLT 201 187   Recent Labs    01/10/19 1355 01/15/19 0305  NA 136 134*  K 4.7 3.9  CL 101 101  CO2 26 23  BUN 16 18  CREATININE 0.81 0.72  GLUCOSE 103* 148*  CALCIUM 9.3 8.7*   No results found for: INR, PROTIME   Recent Radiographic Studies :  No results found.  DISCHARGE INSTRUCTIONS: Discharge Instructions    Call MD / Call 911   Complete by: As directed    If you experience chest pain or shortness of breath, CALL 911 and be transported to the hospital emergency room.  If you develope a fever above 101 F, pus (white drainage) or increased drainage or redness at the wound, or calf pain, call your surgeon's office.   Constipation Prevention   Complete by: As directed    Drink plenty of fluids.  Prune juice may  be helpful.  You may use a stool softener, such as Colace (over the counter) 100 mg twice a day.  Use MiraLax (over the counter) for constipation as needed.   Diet - low sodium heart healthy   Complete by: As directed    Discharge instructions   Complete by: As directed    INSTRUCTIONS AFTER JOINT REPLACEMENT   Remove items at home which could result in a fall. This includes throw rugs or furniture in walking pathways ICE to the affected joint every three hours while awake for 30 minutes at a time, for at least the first 3-5 days, and then as needed for pain and swelling.  Continue to use ice for pain and swelling. You may notice swelling that will progress down to the foot and ankle.  This is normal after surgery.  Elevate your leg when you are not up walking on it.   Continue to use the breathing machine you got in the hospital (incentive spirometer) which will help keep your temperature down.  It is common for your temperature to cycle up and down following surgery, especially at night when you are not up moving around and exerting yourself.  The breathing machine keeps your lungs expanded and your temperature down.   DIET:  As you were doing prior to hospitalization, we recommend a well-balanced diet.  DRESSING / WOUND CARE / SHOWERING  Keep the surgical dressing until follow up.  The dressing is water proof, so you can shower without any extra covering.  IF THE DRESSING FALLS OFF or the wound gets wet inside, change the dressing with sterile gauze.  Please use good hand washing techniques before changing the dressing.  Do not use any lotions or creams on the incision until instructed by your surgeon.    ACTIVITY  Increase activity slowly as tolerated, but follow the weight bearing instructions below.   No driving for 6 weeks or until further direction given by your physician.  You cannot drive while taking narcotics.  No lifting or carrying greater than 10 lbs. until further directed by  your surgeon. Avoid periods of inactivity such as sitting longer than an hour when not asleep. This helps prevent blood clots.  You may return to work once you are authorized by your doctor.     WEIGHT BEARING   Weight bearing as tolerated with assist device (  walker, cane, etc) as directed, use it as long as suggested by your surgeon or therapist, typically at least 4-6 weeks.   EXERCISES  Results after joint replacement surgery are often greatly improved when you follow the exercise, range of motion and muscle strengthening exercises prescribed by your doctor. Safety measures are also important to protect the joint from further injury. Any time any of these exercises cause you to have increased pain or swelling, decrease what you are doing until you are comfortable again and then slowly increase them. If you have problems or questions, call your caregiver or physical therapist for advice.   Rehabilitation is important following a joint replacement. After just a few days of immobilization, the muscles of the leg can become weakened and shrink (atrophy).  These exercises are designed to build up the tone and strength of the thigh and leg muscles and to improve motion. Often times heat used for twenty to thirty minutes before working out will loosen up your tissues and help with improving the range of motion but do not use heat for the first two weeks following surgery (sometimes heat can increase post-operative swelling).   These exercises can be done on a training (exercise) mat, on the floor, on a table or on a bed. Use whatever works the best and is most comfortable for you.    Use music or television while you are exercising so that the exercises are a pleasant break in your day. This will make your life better with the exercises acting as a break in your routine that you can look forward to.   Perform all exercises about fifteen times, three times per day or as directed.  You should exercise  both the operative leg and the other leg as well.   Exercises include:   Quad Sets - Tighten up the muscle on the front of the thigh (Quad) and hold for 5-10 seconds.   Straight Leg Raises - With your knee straight (if you were given a brace, keep it on), lift the leg to 60 degrees, hold for 3 seconds, and slowly lower the leg.  Perform this exercise against resistance later as your leg gets stronger.  Leg Slides: Lying on your back, slowly slide your foot toward your buttocks, bending your knee up off the floor (only go as far as is comfortable). Then slowly slide your foot back down until your leg is flat on the floor again.  Angel Wings: Lying on your back spread your legs to the side as far apart as you can without causing discomfort.  Hamstring Strength:  Lying on your back, push your heel against the floor with your leg straight by tightening up the muscles of your buttocks.  Repeat, but this time bend your knee to a comfortable angle, and push your heel against the floor.  You may put a pillow under the heel to make it more comfortable if necessary.   A rehabilitation program following joint replacement surgery can speed recovery and prevent re-injury in the future due to weakened muscles. Contact your doctor or a physical therapist for more information on knee rehabilitation.    CONSTIPATION  Constipation is defined medically as fewer than three stools per week and severe constipation as less than one stool per week.  Even if you have a regular bowel pattern at home, your normal regimen is likely to be disrupted due to multiple reasons following surgery.  Combination of anesthesia, postoperative narcotics, change in appetite and fluid intake  all can affect your bowels.   YOU MUST use at least one of the following options; they are listed in order of increasing strength to get the job done.  They are all available over the counter, and you may need to use some, POSSIBLY even all of these  options:    Drink plenty of fluids (prune juice may be helpful) and high fiber foods Colace 100 mg by mouth twice a day  Senokot for constipation as directed and as needed Dulcolax (bisacodyl), take with full glass of water  Miralax (polyethylene glycol) once or twice a day as needed.  If you have tried all these things and are unable to have a bowel movement in the first 3-4 days after surgery call either your surgeon or your primary doctor.    If you experience loose stools or diarrhea, hold the medications until you stool forms back up.  If your symptoms do not get better within 1 week or if they get worse, check with your doctor.  If you experience "the worst abdominal pain ever" or develop nausea or vomiting, please contact the office immediately for further recommendations for treatment.   ITCHING:  If you experience itching with your medications, try taking only a single pain pill, or even half a pain pill at a time.  You can also use Benadryl over the counter for itching or also to help with sleep.   TED HOSE STOCKINGS:  Use stockings on both legs until for at least 2 weeks or as directed by physician office. They may be removed at night for sleeping.  MEDICATIONS:  See your medication summary on the "After Visit Summary" that nursing will review with you.  You may have some home medications which will be placed on hold until you complete the course of blood thinner medication.  It is important for you to complete the blood thinner medication as prescribed.  PRECAUTIONS:  If you experience chest pain or shortness of breath - call 911 immediately for transfer to the hospital emergency department.   If you develop a fever greater that 101 F, purulent drainage from wound, increased redness or drainage from wound, foul odor from the wound/dressing, or calf pain - CONTACT YOUR SURGEON.                                                   FOLLOW-UP APPOINTMENTS:  If you do not already have a  post-op appointment, please call the office for an appointment to be seen by your surgeon.  Guidelines for how soon to be seen are listed in your "After Visit Summary", but are typically between 1-4 weeks after surgery.  OTHER INSTRUCTIONS:   Knee Replacement:  Do not place pillow under knee, focus on keeping the knee straight while resting. CPM instructions: 0-90 degrees, 2 hours in the morning, 2 hours in the afternoon, and 2 hours in the evening. Place foam block, curve side up under heel at all times except when in CPM or when walking.  DO NOT modify, tear, cut, or change the foam block in any way.  MAKE SURE YOU:  Understand these instructions.  Get help right away if you are not doing well or get worse.    Thank you for letting us be a part of your medical care team.  It is a privilege we  respect greatly.  We hope these instructions will help you stay on track for a fast and full recovery!   Increase activity slowly as tolerated   Complete by: As directed       DISCHARGE MEDICATIONS:   Allergies as of 01/15/2019   No Known Allergies     Medication List    STOP taking these medications   meloxicam 15 MG tablet Commonly known as: MOBIC     TAKE these medications   acetaminophen 500 MG tablet Commonly known as: TYLENOL Take 500-1,000 mg by mouth every 6 (six) hours as needed for moderate pain or headache.   Alpha Lipoic Acid 200 MG Caps Take 200 mg by mouth at bedtime.   amLODipine 5 MG tablet Commonly known as: NORVASC Take 5 mg by mouth daily.   aspirin 325 MG EC tablet Take 1 tablet (325 mg total) by mouth 2 (two) times daily. What changed:   medication strength  how much to take  when to take this   B COMPLEX-ZINC PO Take 1 tablet by mouth daily at 12 noon.   CALCIUM CARBONATE-VITAMIN D PO Take 1 tablet by mouth daily at 12 noon.   CALCIUM CITRATE +D PO Take 1 tablet by mouth at bedtime.   carvedilol 12.5 MG tablet Commonly known as: COREG Take 12.5  mg by mouth 2 (two) times daily with a meal.   CENTRUM SILVER 50+MEN PO Take 1 tablet by mouth daily.   COD LIVER OIL PO Take 1 capsule by mouth daily at 12 noon.   glyBURIDE 2.5 MG tablet Commonly known as: DIABETA Take 2.5 mg by mouth daily at 12 noon.   lisinopril 20 MG tablet Commonly known as: ZESTRIL Take 10 mg by mouth at bedtime.   lovastatin 40 MG tablet Commonly known as: MEVACOR Take 40 mg by mouth at bedtime.   Magnesium 250 MG Tabs Take 250 mg by mouth daily.   metFORMIN 1000 MG tablet Commonly known as: GLUCOPHAGE Take 1,000 mg by mouth 2 (two) times a day.   methocarbamol 500 MG tablet Commonly known as: ROBAXIN Take 1-2 tablets (500-1,000 mg total) by mouth every 6 (six) hours as needed for muscle spasms.   omeprazole 20 MG tablet Commonly known as: PRILOSEC OTC Take 20 mg by mouth daily.   oxyCODONE 5 MG immediate release tablet Commonly known as: Oxy IR/ROXICODONE Take 1-2 tablets (5-10 mg total) by mouth every 6 (six) hours as needed for moderate pain (pain score 4-6).   Probiotic Caps Take 1 capsule by mouth daily.   vitamin C 500 MG tablet Commonly known as: ASCORBIC ACID Take 500 mg by mouth daily at 12 noon.            Durable Medical Equipment  (From admission, onward)         Start     Ordered   01/14/19 1003  DME Walker rolling  Once    Question:  Patient needs a walker to treat with the following condition  Answer:  S/P total knee replacement   01/14/19 1002   01/14/19 1003  DME 3 n 1  Once     01/14/19 1002   01/14/19 1003  DME Bedside commode  Once    Question:  Patient needs a bedside commode to treat with the following condition  Answer:  S/P total knee replacement   01/14/19 1002          FOLLOW UP VISIT:    DISPOSITION: HOME VS. SNF  CONDITION:  Good   Donia Ast 01/15/2019, 12:38 PM

## 2019-01-15 NOTE — Plan of Care (Signed)
°  Problem: Education: °Goal: Knowledge of the prescribed therapeutic regimen will improve °Outcome: Adequate for Discharge °Goal: Individualized Educational Video(s) °Outcome: Adequate for Discharge °  °Problem: Activity: °Goal: Ability to avoid complications of mobility impairment will improve °Outcome: Adequate for Discharge °Goal: Range of joint motion will improve °Outcome: Adequate for Discharge °  °Problem: Clinical Measurements: °Goal: Postoperative complications will be avoided or minimized °Outcome: Adequate for Discharge °  °Problem: Pain Management: °Goal: Pain level will decrease with appropriate interventions °Outcome: Adequate for Discharge °  °Problem: Skin Integrity: °Goal: Will show signs of wound healing °Outcome: Adequate for Discharge °  °

## 2019-01-21 DIAGNOSIS — Z96652 Presence of left artificial knee joint: Secondary | ICD-10-CM | POA: Diagnosis not present

## 2019-01-21 DIAGNOSIS — M25562 Pain in left knee: Secondary | ICD-10-CM | POA: Diagnosis not present

## 2019-01-21 DIAGNOSIS — R2689 Other abnormalities of gait and mobility: Secondary | ICD-10-CM | POA: Diagnosis not present

## 2019-01-21 DIAGNOSIS — M25462 Effusion, left knee: Secondary | ICD-10-CM | POA: Diagnosis not present

## 2019-01-21 DIAGNOSIS — M62552 Muscle wasting and atrophy, not elsewhere classified, left thigh: Secondary | ICD-10-CM | POA: Diagnosis not present

## 2019-01-23 DIAGNOSIS — Z96652 Presence of left artificial knee joint: Secondary | ICD-10-CM | POA: Diagnosis not present

## 2019-01-23 DIAGNOSIS — R2689 Other abnormalities of gait and mobility: Secondary | ICD-10-CM | POA: Diagnosis not present

## 2019-01-23 DIAGNOSIS — M25562 Pain in left knee: Secondary | ICD-10-CM | POA: Diagnosis not present

## 2019-01-23 DIAGNOSIS — M62552 Muscle wasting and atrophy, not elsewhere classified, left thigh: Secondary | ICD-10-CM | POA: Diagnosis not present

## 2019-01-23 DIAGNOSIS — M25462 Effusion, left knee: Secondary | ICD-10-CM | POA: Diagnosis not present

## 2019-01-24 DIAGNOSIS — Z96652 Presence of left artificial knee joint: Secondary | ICD-10-CM | POA: Diagnosis not present

## 2019-01-24 DIAGNOSIS — I739 Peripheral vascular disease, unspecified: Secondary | ICD-10-CM | POA: Diagnosis not present

## 2019-01-25 DIAGNOSIS — M25562 Pain in left knee: Secondary | ICD-10-CM | POA: Diagnosis not present

## 2019-01-25 DIAGNOSIS — R2689 Other abnormalities of gait and mobility: Secondary | ICD-10-CM | POA: Diagnosis not present

## 2019-01-25 DIAGNOSIS — M25462 Effusion, left knee: Secondary | ICD-10-CM | POA: Diagnosis not present

## 2019-01-25 DIAGNOSIS — Z96652 Presence of left artificial knee joint: Secondary | ICD-10-CM | POA: Diagnosis not present

## 2019-01-25 DIAGNOSIS — M62552 Muscle wasting and atrophy, not elsewhere classified, left thigh: Secondary | ICD-10-CM | POA: Diagnosis not present

## 2019-01-26 DIAGNOSIS — K219 Gastro-esophageal reflux disease without esophagitis: Secondary | ICD-10-CM | POA: Diagnosis not present

## 2019-01-26 DIAGNOSIS — R509 Fever, unspecified: Secondary | ICD-10-CM | POA: Diagnosis not present

## 2019-01-26 DIAGNOSIS — I10 Essential (primary) hypertension: Secondary | ICD-10-CM | POA: Diagnosis not present

## 2019-01-26 DIAGNOSIS — Z7982 Long term (current) use of aspirin: Secondary | ICD-10-CM | POA: Diagnosis not present

## 2019-01-26 DIAGNOSIS — M7989 Other specified soft tissue disorders: Secondary | ICD-10-CM | POA: Diagnosis not present

## 2019-01-26 DIAGNOSIS — E7800 Pure hypercholesterolemia, unspecified: Secondary | ICD-10-CM | POA: Diagnosis not present

## 2019-01-26 DIAGNOSIS — Z7984 Long term (current) use of oral hypoglycemic drugs: Secondary | ICD-10-CM | POA: Diagnosis not present

## 2019-01-26 DIAGNOSIS — Z79899 Other long term (current) drug therapy: Secondary | ICD-10-CM | POA: Diagnosis not present

## 2019-01-26 DIAGNOSIS — M79605 Pain in left leg: Secondary | ICD-10-CM | POA: Diagnosis not present

## 2019-01-26 DIAGNOSIS — L03116 Cellulitis of left lower limb: Secondary | ICD-10-CM | POA: Diagnosis not present

## 2019-01-26 DIAGNOSIS — E119 Type 2 diabetes mellitus without complications: Secondary | ICD-10-CM | POA: Diagnosis not present

## 2019-01-26 DIAGNOSIS — Z96652 Presence of left artificial knee joint: Secondary | ICD-10-CM | POA: Diagnosis not present

## 2019-02-04 ENCOUNTER — Encounter (HOSPITAL_COMMUNITY): Payer: Self-pay | Admitting: Emergency Medicine

## 2019-02-04 ENCOUNTER — Other Ambulatory Visit: Payer: Self-pay

## 2019-02-04 ENCOUNTER — Inpatient Hospital Stay (HOSPITAL_COMMUNITY)
Admission: EM | Admit: 2019-02-04 | Discharge: 2019-02-08 | DRG: 418 | Disposition: A | Discharge status: Home / Self Care | Payer: PPO | Attending: Internal Medicine | Admitting: Internal Medicine

## 2019-02-04 ENCOUNTER — Emergency Department (HOSPITAL_COMMUNITY): Payer: PPO

## 2019-02-04 DIAGNOSIS — E1159 Type 2 diabetes mellitus with other circulatory complications: Secondary | ICD-10-CM | POA: Diagnosis not present

## 2019-02-04 DIAGNOSIS — R911 Solitary pulmonary nodule: Secondary | ICD-10-CM | POA: Diagnosis present

## 2019-02-04 DIAGNOSIS — K5730 Diverticulosis of large intestine without perforation or abscess without bleeding: Secondary | ICD-10-CM | POA: Diagnosis present

## 2019-02-04 DIAGNOSIS — K810 Acute cholecystitis: Secondary | ICD-10-CM | POA: Diagnosis not present

## 2019-02-04 DIAGNOSIS — Z7982 Long term (current) use of aspirin: Secondary | ICD-10-CM

## 2019-02-04 DIAGNOSIS — E785 Hyperlipidemia, unspecified: Secondary | ICD-10-CM | POA: Diagnosis not present

## 2019-02-04 DIAGNOSIS — Z8249 Family history of ischemic heart disease and other diseases of the circulatory system: Secondary | ICD-10-CM

## 2019-02-04 DIAGNOSIS — Z7984 Long term (current) use of oral hypoglycemic drugs: Secondary | ICD-10-CM | POA: Diagnosis not present

## 2019-02-04 DIAGNOSIS — E871 Hypo-osmolality and hyponatremia: Secondary | ICD-10-CM | POA: Diagnosis present

## 2019-02-04 DIAGNOSIS — Z96652 Presence of left artificial knee joint: Secondary | ICD-10-CM | POA: Diagnosis present

## 2019-02-04 DIAGNOSIS — I959 Hypotension, unspecified: Secondary | ICD-10-CM | POA: Diagnosis not present

## 2019-02-04 DIAGNOSIS — R1084 Generalized abdominal pain: Secondary | ICD-10-CM | POA: Diagnosis not present

## 2019-02-04 DIAGNOSIS — L03116 Cellulitis of left lower limb: Secondary | ICD-10-CM | POA: Diagnosis present

## 2019-02-04 DIAGNOSIS — K8050 Calculus of bile duct without cholangitis or cholecystitis without obstruction: Secondary | ICD-10-CM | POA: Diagnosis not present

## 2019-02-04 DIAGNOSIS — K8062 Calculus of gallbladder and bile duct with acute cholecystitis without obstruction: Principal | ICD-10-CM | POA: Diagnosis present

## 2019-02-04 DIAGNOSIS — R932 Abnormal findings on diagnostic imaging of liver and biliary tract: Secondary | ICD-10-CM | POA: Diagnosis not present

## 2019-02-04 DIAGNOSIS — Z419 Encounter for procedure for purposes other than remedying health state, unspecified: Secondary | ICD-10-CM

## 2019-02-04 DIAGNOSIS — Z8719 Personal history of other diseases of the digestive system: Secondary | ICD-10-CM | POA: Diagnosis not present

## 2019-02-04 DIAGNOSIS — E119 Type 2 diabetes mellitus without complications: Secondary | ICD-10-CM | POA: Diagnosis not present

## 2019-02-04 DIAGNOSIS — E1169 Type 2 diabetes mellitus with other specified complication: Secondary | ICD-10-CM | POA: Diagnosis not present

## 2019-02-04 DIAGNOSIS — K838 Other specified diseases of biliary tract: Secondary | ICD-10-CM | POA: Diagnosis not present

## 2019-02-04 DIAGNOSIS — I10 Essential (primary) hypertension: Secondary | ICD-10-CM | POA: Diagnosis not present

## 2019-02-04 DIAGNOSIS — Z20828 Contact with and (suspected) exposure to other viral communicable diseases: Secondary | ICD-10-CM | POA: Diagnosis present

## 2019-02-04 DIAGNOSIS — L0390 Cellulitis, unspecified: Secondary | ICD-10-CM | POA: Diagnosis not present

## 2019-02-04 DIAGNOSIS — M1990 Unspecified osteoarthritis, unspecified site: Secondary | ICD-10-CM | POA: Diagnosis present

## 2019-02-04 DIAGNOSIS — R1011 Right upper quadrant pain: Secondary | ICD-10-CM | POA: Diagnosis present

## 2019-02-04 DIAGNOSIS — Z833 Family history of diabetes mellitus: Secondary | ICD-10-CM

## 2019-02-04 DIAGNOSIS — R0902 Hypoxemia: Secondary | ICD-10-CM | POA: Diagnosis not present

## 2019-02-04 DIAGNOSIS — K219 Gastro-esophageal reflux disease without esophagitis: Secondary | ICD-10-CM | POA: Diagnosis present

## 2019-02-04 DIAGNOSIS — I152 Hypertension secondary to endocrine disorders: Secondary | ICD-10-CM

## 2019-02-04 DIAGNOSIS — D649 Anemia, unspecified: Secondary | ICD-10-CM | POA: Diagnosis present

## 2019-02-04 DIAGNOSIS — K8020 Calculus of gallbladder without cholecystitis without obstruction: Secondary | ICD-10-CM | POA: Diagnosis not present

## 2019-02-04 DIAGNOSIS — K8010 Calculus of gallbladder with chronic cholecystitis without obstruction: Secondary | ICD-10-CM | POA: Diagnosis not present

## 2019-02-04 DIAGNOSIS — R52 Pain, unspecified: Secondary | ICD-10-CM | POA: Diagnosis not present

## 2019-02-04 LAB — URINALYSIS, ROUTINE W REFLEX MICROSCOPIC
Bacteria, UA: NONE SEEN
Bilirubin Urine: NEGATIVE
Glucose, UA: NEGATIVE mg/dL
Hgb urine dipstick: NEGATIVE
Ketones, ur: NEGATIVE mg/dL
Leukocytes,Ua: NEGATIVE
Nitrite: NEGATIVE
Protein, ur: 30 mg/dL — AB
Specific Gravity, Urine: 1.015 (ref 1.005–1.030)
pH: 6 (ref 5.0–8.0)

## 2019-02-04 LAB — CBC WITH DIFFERENTIAL/PLATELET
Abs Immature Granulocytes: 0.11 10*3/uL — ABNORMAL HIGH (ref 0.00–0.07)
Basophils Absolute: 0 10*3/uL (ref 0.0–0.1)
Basophils Relative: 0 %
Eosinophils Absolute: 0 10*3/uL (ref 0.0–0.5)
Eosinophils Relative: 0 %
HCT: 32.9 % — ABNORMAL LOW (ref 39.0–52.0)
Hemoglobin: 10.9 g/dL — ABNORMAL LOW (ref 13.0–17.0)
Immature Granulocytes: 1 %
Lymphocytes Relative: 4 %
Lymphs Abs: 0.7 10*3/uL (ref 0.7–4.0)
MCH: 31.7 pg (ref 26.0–34.0)
MCHC: 33.1 g/dL (ref 30.0–36.0)
MCV: 95.6 fL (ref 80.0–100.0)
Monocytes Absolute: 1.2 10*3/uL — ABNORMAL HIGH (ref 0.1–1.0)
Monocytes Relative: 8 %
Neutro Abs: 13.5 10*3/uL — ABNORMAL HIGH (ref 1.7–7.7)
Neutrophils Relative %: 87 %
Platelets: 486 10*3/uL — ABNORMAL HIGH (ref 150–400)
RBC: 3.44 MIL/uL — ABNORMAL LOW (ref 4.22–5.81)
RDW: 13 % (ref 11.5–15.5)
WBC: 15.6 10*3/uL — ABNORMAL HIGH (ref 4.0–10.5)
nRBC: 0 % (ref 0.0–0.2)

## 2019-02-04 LAB — COMPREHENSIVE METABOLIC PANEL
ALT: 81 U/L — ABNORMAL HIGH (ref 0–44)
AST: 82 U/L — ABNORMAL HIGH (ref 15–41)
Albumin: 3.7 g/dL (ref 3.5–5.0)
Alkaline Phosphatase: 207 U/L — ABNORMAL HIGH (ref 38–126)
Anion gap: 11 (ref 5–15)
BUN: 13 mg/dL (ref 8–23)
CO2: 24 mmol/L (ref 22–32)
Calcium: 9.1 mg/dL (ref 8.9–10.3)
Chloride: 94 mmol/L — ABNORMAL LOW (ref 98–111)
Creatinine, Ser: 0.73 mg/dL (ref 0.61–1.24)
GFR calc Af Amer: >60 mL/min (ref >60)
GFR calc non Af Amer: >60 mL/min (ref >60)
Glucose, Bld: 130 mg/dL — ABNORMAL HIGH (ref 70–99)
Potassium: 4.5 mmol/L (ref 3.5–5.1)
Sodium: 129 mmol/L — ABNORMAL LOW (ref 135–145)
Total Bilirubin: 1.1 mg/dL (ref 0.3–1.2)
Total Protein: 7.4 g/dL (ref 6.5–8.1)

## 2019-02-04 LAB — LIPASE, BLOOD: Lipase: 35 U/L (ref 11–51)

## 2019-02-04 LAB — GLUCOSE, CAPILLARY: Glucose-Capillary: 107 mg/dL — ABNORMAL HIGH (ref 70–99)

## 2019-02-04 MED ORDER — MORPHINE SULFATE (PF) 2 MG/ML IV SOLN: 1.0000 mg | INTRAVENOUS | Status: DC | PRN

## 2019-02-04 MED ORDER — PANTOPRAZOLE SODIUM 40 MG PO TBEC
40.0000 mg | DELAYED_RELEASE_TABLET | Freq: Every day | ORAL | Status: DC
  Administered 2019-02-07 – 2019-02-08 (×2): 40 mg via ORAL
  Filled 2019-02-04 (×3): qty 1

## 2019-02-04 MED ORDER — PIPERACILLIN-TAZOBACTAM 3.375 G IVPB 30 MIN
3.3750 g | Freq: Once | INTRAVENOUS | Status: AC
  Administered 2019-02-04: 3.375 g via INTRAVENOUS
  Filled 2019-02-04: qty 50

## 2019-02-04 MED ORDER — CARVEDILOL 12.5 MG PO TABS
12.5000 mg | ORAL_TABLET | Freq: Two times a day (BID) | ORAL | Status: DC
  Administered 2019-02-05 – 2019-02-08 (×7): 12.5 mg via ORAL
  Filled 2019-02-04 (×8): qty 1

## 2019-02-04 MED ORDER — ONDANSETRON HCL 4 MG/2ML IJ SOLN
4.0000 mg | Freq: Once | INTRAMUSCULAR | Status: AC
  Administered 2019-02-04: 4 mg via INTRAVENOUS
  Filled 2019-02-04: qty 2

## 2019-02-04 MED ORDER — ONDANSETRON HCL 4 MG PO TABS: 4.0000 mg | ORAL_TABLET | Freq: Four times a day (QID) | ORAL | Status: DC | PRN

## 2019-02-04 MED ORDER — PRAVASTATIN SODIUM 20 MG PO TABS
40.0000 mg | ORAL_TABLET | Freq: Every day | ORAL | Status: DC
  Administered 2019-02-05 – 2019-02-07 (×3): 40 mg via ORAL
  Filled 2019-02-04 (×3): qty 2

## 2019-02-04 MED ORDER — ACETAMINOPHEN 325 MG PO TABS
650.0000 mg | ORAL_TABLET | Freq: Four times a day (QID) | ORAL | Status: DC | PRN
  Administered 2019-02-07 – 2019-02-08 (×2): 650 mg via ORAL
  Filled 2019-02-04 (×2): qty 2

## 2019-02-04 MED ORDER — IOHEXOL 300 MG/ML  SOLN
100.0000 mL | Freq: Once | INTRAMUSCULAR | Status: AC | PRN
  Administered 2019-02-04: 100 mL via INTRAVENOUS

## 2019-02-04 MED ORDER — ONDANSETRON HCL 4 MG/2ML IJ SOLN: 4.0000 mg | Freq: Four times a day (QID) | INTRAMUSCULAR | Status: DC | PRN

## 2019-02-04 MED ORDER — INSULIN ASPART 100 UNIT/ML ~~LOC~~ SOLN
0.0000 [IU] | SUBCUTANEOUS | Status: DC
  Administered 2019-02-05: 21:00:00 2 [IU] via SUBCUTANEOUS
  Administered 2019-02-05 – 2019-02-06 (×4): 1 [IU] via SUBCUTANEOUS
  Administered 2019-02-06: 2 [IU] via SUBCUTANEOUS
  Administered 2019-02-06 – 2019-02-07 (×2): 1 [IU] via SUBCUTANEOUS
  Administered 2019-02-07: 21:00:00 3 [IU] via SUBCUTANEOUS
  Administered 2019-02-08 (×3): 2 [IU] via SUBCUTANEOUS
  Administered 2019-02-08: 05:00:00 1 [IU] via SUBCUTANEOUS
  Filled 2019-02-04: qty 0.09

## 2019-02-04 MED ORDER — OXYCODONE HCL 5 MG PO TABS
5.0000 mg | ORAL_TABLET | ORAL | Status: DC | PRN
  Administered 2019-02-07 – 2019-02-08 (×2): 5 mg via ORAL
  Filled 2019-02-04 (×2): qty 1

## 2019-02-04 MED ORDER — SODIUM CHLORIDE 0.9 % IV SOLN
INTRAVENOUS | Status: DC
  Administered 2019-02-04 (×2): via INTRAVENOUS

## 2019-02-04 MED ORDER — SENNOSIDES-DOCUSATE SODIUM 8.6-50 MG PO TABS: 1.0000 | ORAL_TABLET | Freq: Every evening | ORAL | Status: DC | PRN

## 2019-02-04 MED ORDER — SODIUM CHLORIDE 0.9 % IV SOLN
INTRAVENOUS | Status: AC
  Administered 2019-02-04 – 2019-02-05 (×2): via INTRAVENOUS

## 2019-02-04 MED ORDER — SODIUM CHLORIDE (PF) 0.9 % IJ SOLN
INTRAMUSCULAR | Status: AC
  Administered 2019-02-04: 20:00:00
  Filled 2019-02-04: qty 50

## 2019-02-04 MED ORDER — ACETAMINOPHEN 650 MG RE SUPP: 650.0000 mg | Freq: Four times a day (QID) | RECTAL | Status: DC | PRN

## 2019-02-04 NOTE — ED Triage Notes (Signed)
EMS states pt complains of weakness and abdominal pain. Pt was nauseous, generalized body ache. 84/58 on scene. Pt states that he has been on an antibiotic for left knee replacement done at [facility] on 01/14/2019.   BP: 122/81 HR: 84 SpO2: 96 RA RR:18 CBG 139  20ga LAC 519mL NS on route

## 2019-02-04 NOTE — ED Notes (Signed)
ED TO INPATIENT HANDOFF REPORT  Name/Age/Gender Timothy Farrell 83 y.o. male  Code Status Code Status History    Date Active Date Inactive Code Status Order ID Comments User Context   01/14/2019 1003 01/15/2019 1530 Full Code [id number]  Donia Ast, PA Inpatient   Advance Care Planning Activity      Home/SNF/Other Home  Chief Complaint Weakness;Hypotension; Abdominal Pain  Level of Care/Admitting Diagnosis ED Disposition    ED Disposition Condition Comment   Admit  Hospital Area: [facility] [[id number]]  Level of Care: Med-Surg [16]  Covid Evaluation: Asymptomatic Screening Protocol (No Symptoms)  Diagnosis: Choledocholithiasis [id number]  Admitting Physician: Lenore Cordia [id number]  Attending Physician: Lenore Cordia [id number]  PT Class (Do Not Modify): Observation [104]  PT Acc Code (Do Not Modify): Observation [10022]       Medical History Past Medical History:  Diagnosis Date  . Arthritis   . Diabetes mellitus without complication ([place])   . GERD (gastroesophageal reflux disease)   . Hypertension     Allergies No Known Allergies  IV Location/Drains/Wounds Patient Lines/Drains/Airways Status   Active Line/Drains/Airways    Name:   Placement date:   Placement time:   Site:   Days:   Peripheral IV 02/04/19 Left Antecubital   02/04/19    1646    Antecubital   less than 1   Incision (Closed) 01/14/19 Knee Left   01/14/19    0855     21          Labs/Imaging Results for orders placed or performed during the hospital encounter of 02/04/19 (from the past 48 hour(s))  Comprehensive metabolic panel     Status: Abnormal   Collection Time: 02/04/19  5:35 PM  Result Value Ref Range   Sodium 129 (L) 135 - 145 mmol/L   Potassium 4.5 3.5 - 5.1 mmol/L   Chloride 94 (L) 98 - 111 mmol/L   CO2 24 22 - 32 mmol/L   Glucose, Bld 130 (H) 70 - 99 mg/dL   BUN 13 8 - 23 mg/dL   Creatinine, Ser 0.73 0.61 - 1.24 mg/dL   Calcium 9.1 8.9 - 10.3 mg/dL    Total Protein 7.4 6.5 - 8.1 g/dL   Albumin 3.7 3.5 - 5.0 g/dL   AST 82 (H) 15 - 41 U/L   ALT 81 (H) 0 - 44 U/L   Alkaline Phosphatase 207 (H) 38 - 126 U/L   Total Bilirubin 1.1 0.3 - 1.2 mg/dL   GFR calc non Af Amer >60 >60 mL/min   GFR calc Af Amer >60 >60 mL/min   Anion gap 11 5 - 15    Comment: Performed at [facility], [place] [address] [place], [place] [address]  Lipase, blood     Status: None   Collection Time: 02/04/19  5:35 PM  Result Value Ref Range   Lipase 35 11 - 51 U/L    Comment: Performed at [facility], [place] [address], [place],  [address]  CBC WITH DIFFERENTIAL     Status: Abnormal   Collection Time: 02/04/19  5:35 PM  Result Value Ref Range   WBC 15.6 (H) 4.0 - 10.5 K/uL   RBC 3.44 (L) 4.22 - 5.81 MIL/uL   Hemoglobin 10.9 (L) 13.0 - 17.0 g/dL   HCT 32.9 (L) 39.0 - 52.0 %   MCV 95.6 80.0 - 100.0 fL   MCH 31.7 26.0 - 34.0 pg   MCHC 33.1 30.0 -  36.0 g/dL   RDW 13.0 11.5 - 15.5 %   Platelets 486 (H) 150 - 400 K/uL   nRBC 0.0 0.0 - 0.2 %   Neutrophils Relative % 87 %   Neutro Abs 13.5 (H) 1.7 - 7.7 K/uL   Lymphocytes Relative 4 %   Lymphs Abs 0.7 0.7 - 4.0 K/uL   Monocytes Relative 8 %   Monocytes Absolute 1.2 (H) 0.1 - 1.0 K/uL   Eosinophils Relative 0 %   Eosinophils Absolute 0.0 0.0 - 0.5 K/uL   Basophils Relative 0 %   Basophils Absolute 0.0 0.0 - 0.1 K/uL   Immature Granulocytes 1 %   Abs Immature Granulocytes 0.11 (H) 0.00 - 0.07 K/uL    Comment: Performed at [facility], [place] [address] [place], [place] [address]  Urinalysis, Routine w reflex microscopic     Status: Abnormal   Collection Time: 02/04/19  5:35 PM  Result Value Ref Range   Color, Urine YELLOW YELLOW   APPearance CLEAR CLEAR   Specific Gravity, Urine 1.015 1.005 - 1.030   pH 6.0 5.0 - 8.0   Glucose, UA NEGATIVE NEGATIVE mg/dL   Hgb urine dipstick NEGATIVE NEGATIVE   Bilirubin Urine NEGATIVE NEGATIVE   Ketones, ur  NEGATIVE NEGATIVE mg/dL   Protein, ur 30 (A) NEGATIVE mg/dL   Nitrite NEGATIVE NEGATIVE   Leukocytes,Ua NEGATIVE NEGATIVE   RBC / HPF 0-5 0 - 5 RBC/hpf   WBC, UA 0-5 0 - 5 WBC/hpf   Bacteria, UA NONE SEEN NONE SEEN   Mucus PRESENT    Hyaline Casts, UA PRESENT     Comment: Performed at [facility], [place] [address] [place], [place] [address]   Ct Abdomen Pelvis W Contrast  Result Date: 02/04/2019 CLINICAL DATA:  LEFT total knee arthroplasty 01/14/2019. Acute onset of generalized weakness, generalized abdominal pain, fever and myalgias. Mild hypotension upon EMS arrival. EXAM: CT ABDOMEN AND PELVIS WITH CONTRAST TECHNIQUE: Multidetector CT imaging of the abdomen and pelvis was performed using the standard protocol following bolus administration of intravenous contrast. CONTRAST:  152mL OMNIPAQUE IOHEXOL 300 MG/ML IV. COMPARISON:  None. FINDINGS: Lower chest: Minimal scarring in the lingula. Very small (approximate 3 mm) nodule in the LATERAL segment RIGHT MIDDLE LOBE (series 4, image 10). Visualized lung bases otherwise clear. Heart size normal. Mild RIGHT coronary artery atherosclerosis. Hepatobiliary: Mild intra and extrahepatic biliary ductal dilation, the common duct measuring up to 13 mm diameter. Very small (approximate 2 mm) stone in the distal common bile duct, though the duct is dilated beyond this stone, so this stone is not causing obstruction. Multiple small calcified gallstones in the gallbladder, the largest on the order of 6 mm. No evidence of pericholecystic edema or inflammation. No focal hepatic parenchymal abnormalities. Pancreas: Markedly atrophic pancreatic body and tail. Calcifications involving the pancreatic head. No pancreatic mass or peripancreatic inflammation. Spleen: Normal in size and appearance. Adrenals/Urinary Tract: Approximate 1.3 x 1.1 x 0.9 cm low-attenuation nodule involving the LEFT adrenal gland. Normal appearing RIGHT adrenal gland. Benign simple  cyst arising from the ANTERIOR LOWER pole of the RIGHT kidney measuring approximately 6.4 x 5.2 cm. No solid masses involving either kidney. No hydronephrosis. No opaque urinary tract calculi. Normal appearing decompressed urinary bladder. Stomach/Bowel: Moderate-sized hiatal hernia. Stomach decompressed and otherwise normal in appearance. Normal-appearing small bowel. Diffuse colonic diverticulosis, most extensive in the sigmoid region, without evidence of acute diverticulitis. Liquid stool throughout the colon. Mobile cecum positioned in the RIGHT UPPER QUADRANT.  Appendix not conspicuous, but no pericecal inflammation. Vascular/Lymphatic: Moderate aorto-iliofemoral atherosclerosis without evidence of aneurysm. Normal-appearing portal venous and systemic venous systems. No pathologic lymphadenopathy. Reproductive: Moderate to marked prostate gland enlargement. Asymmetric enlargement of the LEFT seminal vesicles. Other: Small BILATERAL inguinal hernias containing fat, LEFT larger than RIGHT. Musculoskeletal: Multilevel degenerative disc disease and spondylosis throughout the lumbar spine. Facet degenerative changes throughout the lumbar spine. Desiccated disc extrusion at L5-S1 with INFERIOR migration of the desiccated disc fragment. LOWER thoracic spondylosis. No acute findings. IMPRESSION: 1. Choledocholithiasis, with a very small (approximate 2 mm) stone in the distal common bile duct; as the duct is dilated distal to this stone, it is not causing obstruction. 2. Cholelithiasis without CT evidence of acute cholecystitis. 3. Moderate-sized hiatal hernia. 4. Diffuse colonic diverticulosis without evidence of acute diverticulitis. Liquid stool throughout the colon. 5. Moderate to marked prostate gland enlargement. Asymmetric enlargement of the LEFT seminal vesicles. Correlate with PSA in order to exclude a prostate malignancy with LEFT seminal vesicle involvement. 6. Small BILATERAL inguinal hernias containing fat,  LEFT larger than RIGHT. 7. Likely benign LEFT adrenal adenoma. 8. Very small 3 mm nodule in the LATERAL segment of the visualized RIGHT MIDDLE LOBE. Consider non-emergent CT chest to exclude other nodules elsewhere. Aortic Atherosclerosis (ICD10-I70.0). Electronically Signed   By: Evangeline Dakin M.D.   On: 02/04/2019 19:59    Pending Labs Unresulted Labs (From admission, onward)    Start     Ordered   02/04/19 2100  SARS CORONAVIRUS 2 (TAT 6-12 HRS) Nasal Swab Aptima Multi Swab  (Asymptomatic/Tier 2 Patients Labs)  Once,   STAT    Question Answer Comment  Is this test for diagnosis or screening Screening   Symptomatic for COVID-19 as defined by CDC No   Hospitalized for COVID-19 No   Admitted to ICU for COVID-19 No   Previously tested for COVID-19 Yes   Resident in a congregate (group) care setting No   Employed in healthcare setting No      02/04/19 2101          Vitals/Pain Today's Vitals   02/04/19 1646 02/04/19 1649 02/04/19 1756 02/04/19 1815  BP: (!) 148/67  140/77 (!) 144/70  Pulse: 84  87 92  Resp: 16  18 18   Temp: 98.1 F (36.7 C)     TempSrc: Oral     SpO2: 99%  98% 97%  Weight:  81.6 kg    Height:  5\' 11"  (1.803 m)    PainSc: 0-No pain       Isolation Precautions No active isolations  Medications Medications  0.9 %  sodium chloride infusion ( Intravenous New Bag/Given 02/04/19 2120)  ondansetron (ZOFRAN) injection 4 mg (4 mg Intravenous Given 02/04/19 1749)  iohexol (OMNIPAQUE) 300 MG/ML solution 100 mL (100 mLs Intravenous Contrast Given 02/04/19 1933)  sodium chloride (PF) 0.9 % injection (  Given by Other 02/04/19 2003)  piperacillin-tazobactam (ZOSYN) IVPB 3.375 g (3.375 g Intravenous New Bag/Given 02/04/19 2119)    Mobility walks with device

## 2019-02-04 NOTE — ED Notes (Signed)
Pt. Documented in error see above note in chart. 

## 2019-02-04 NOTE — ED Notes (Signed)
Patient transported to CT 

## 2019-02-04 NOTE — H&P (Signed)
History and Physical    Timothy Farrell LAG:[id number] DOB: May 03, 1936 DOA: 02/04/2019  PCP: Myrlene Broker, MD  Patient coming from: Home  I have personally briefly reviewed patient's old medical records in [place]  Chief Complaint: Abdominal pain  HPI: Timothy Farrell is a 83 y.o. male with medical history significant for type 2 diabetes, hypertension, hyperlipidemia, GERD, and osteoarthritis s/p left TKA on 01/14/2019 who presents the ED for evaluation of abdominal pain.  Patient reports about 1.5 weeks of persistent nausea without emesis.  He initially attributed this to postop symptoms after left TKA performed on 01/14/2019.  On 01/28/2019 he went to [facility] ED for evaluation of a new fever.  He was reportedly diagnosed with cellulitis of the left lower extremity and placed on a course of cephalexin.  Since then he has had persistent nausea, poor appetite, and intermittent diaphoresis.  Today he developed new onset of epigastric and central abdominal pain described as a dull aching sensation.  He has had associated feeling of being bloated and indigestion.  He says he became lightheaded and nearly passed out today.  He says his blood pressure was low into the 12Y systolic on his home checks.  Due to the new symptoms he called EMS for evaluation in the ED.  Patient otherwise reports good bowel movements and urination without dysuria.  He denies any chest pain or dyspnea.  He is ambulating well with the use of a cane and walker at home.  ED Course:  Initial vitals showed BP 148/67, pulse 84, RR 16, temp 98.1 Fahrenheit, SPO2 99% on room air.  Labs notable for Sodium 129, potassium 4.5, BUN 13, creatinine 0.73, AST 82, ALT 81, alk phos 207, total bilirubin 1.1, lipase 35, WBC 15.6, hemoglobin 10.9, platelets 486,000.  Urinalysis is negative for UTI.  SARS-CoV-2 test was obtained and pending.  CT abdomen/pelvis with contrast showed choledocholithiasis with approximate 2 mm  stone in the distal CBD, cholelithiasis without CT evidence of acute cholecystitis.  Diffuse colonic diverticulosis without evidence of diverticulitis noted with liquid stool throughout the colon.  Prostate gland enlargement with asymmetric enlargement of the left seminal vesicle was seen.  A 3 mm nodule in the lateral segment of the right middle lobe was noted.  Patient was given IV fluids and started on IV Zosyn.  EDP discussed with on-call GI, Dr. Benson Norway, who recommended continued IV antibiotics and keep n.p.o. at midnight for potential intervention tomorrow.  The hospitalist service was consulted to admit.   Review of Systems: All systems reviewed and are negative except as documented in history of present illness above.   Past Medical History:  Diagnosis Date   Arthritis    Diabetes mellitus without complication ([place])    GERD (gastroesophageal reflux disease)    Hypertension     Past Surgical History:  Procedure Laterality Date   COLONOSCOPY     FRACTURE SURGERY Left 2005   plate and screws   TONSILLECTOMY     TOTAL KNEE ARTHROPLASTY Left 01/14/2019   Procedure: TOTAL KNEE ARTHROPLASTY;  Surgeon: Vickey Huger, MD;  Location: [facility] ORS;  Service: Orthopedics;  Laterality: Left;   VASCULAR SURGERY  1960   rt leg    Social History:  reports that he has never smoked. He has never used smokeless tobacco. He reports that he does not drink alcohol or use drugs.  No Known Allergies  Family History  Problem Relation Age of Onset   Diabetes Mother  Hypertension Father    Heart disease Father      Prior to Admission medications   Medication Sig Start Date End Date Taking? Authorizing Provider  acetaminophen (TYLENOL) 500 MG tablet Take 500-1,000 mg by mouth every 6 (six) hours as needed for moderate pain or headache.   Yes [provider]  amLODipine (NORVASC) 5 MG tablet Take 5 mg by mouth daily.   Yes [provider]  aspirin EC 81 MG tablet Take 81  mg by mouth daily.   Yes [provider]  B COMPLEX-ZINC PO Take 1 tablet by mouth daily at 12 noon.   Yes [provider]  carvedilol (COREG) 12.5 MG tablet Take 12.5 mg by mouth 2 (two) times daily with a meal.   Yes [provider]  COD LIVER OIL PO Take 1 capsule by mouth daily at 12 noon.   Yes [provider]  glyBURIDE (DIABETA) 2.5 MG tablet Take 2.5 mg by mouth daily at 12 noon.   Yes [provider]  lisinopril (ZESTRIL) 20 MG tablet Take 10 mg by mouth at bedtime.   Yes [provider]  lovastatin (MEVACOR) 40 MG tablet Take 40 mg by mouth at bedtime.   Yes [provider]  metFORMIN (GLUCOPHAGE) 1000 MG tablet Take 1,000 mg by mouth 2 (two) times a day.   Yes [provider]  methocarbamol (ROBAXIN) 500 MG tablet Take 1-2 tablets (500-1,000 mg total) by mouth every 6 (six) hours as needed for muscle spasms. 01/15/19  Yes Donia Ast, PA  Multiple Vitamins-Minerals (CENTRUM SILVER 50+MEN PO) Take 1 tablet by mouth daily.   Yes [provider]  omeprazole (PRILOSEC OTC) 20 MG tablet Take 20 mg by mouth daily.   Yes [provider]  oxyCODONE (OXY IR/ROXICODONE) 5 MG immediate release tablet Take 1-2 tablets (5-10 mg total) by mouth every 6 (six) hours as needed for moderate pain (pain score 4-6). 01/15/19  Yes Donia Ast, PA  Probiotic CAPS Take 1 capsule by mouth daily.   Yes [provider]  vitamin C (ASCORBIC ACID) 500 MG tablet Take 500 mg by mouth daily at 12 noon.   Yes [provider]  aspirin EC 325 MG EC tablet Take 1 tablet (325 mg total) by mouth 2 (two) times daily. Patient not taking: Reported on 02/04/2019 01/15/19   Donia Ast, [place]    Physical Exam: Vitals:   02/04/19 1646 02/04/19 1649 02/04/19 1756 02/04/19 1815  BP: (!) 148/67  140/77 (!) 144/70  Pulse: 84  87 92  Resp: 16  18 18   Temp: 98.1 F (36.7 C)     TempSrc: Oral     SpO2: 99%   98% 97%  Weight:  81.6 kg    Height:  5' 11"  (1.803 m)      Constitutional: Resting in bed with head elevated, NAD, calm, appears tired but comfortable Eyes: PERRL, lids and conjunctivae normal ENMT: Mucous membranes are moist. Posterior pharynx clear of any exudate or lesions.Normal dentition.  Neck: normal, supple, no masses. Respiratory: clear to auscultation bilaterally, no wheezing, no crackles. Normal respiratory effort. No accessory muscle use.  Cardiovascular: Regular rate and rhythm, no murmurs / rubs / gallops.  Trace LLE edema. 2+ pedal pulses. Abdomen: no tenderness, no masses palpated. No hepatosplenomegaly. Bowel sounds positive.  Musculoskeletal: S/p left TKA with well-healing appearing surgical wound, no clubbing / cyanosis. No joint deformity upper and lower extremities. Good ROM, no contractures. Normal muscle  tone.  Skin: Diaphoretic, no rashes, lesions, ulcers. No induration Neurologic: CN 2-12 grossly intact. Sensation intact, Strength 5/5 in all 4.  Psychiatric: Normal judgment and insight. Alert and oriented x 3. Normal mood.     Labs on Admission: I have personally reviewed following labs and imaging studies  CBC: Recent Labs  Lab 02/04/19 1735  WBC 15.6*  NEUTROABS 13.5*  HGB 10.9*  HCT 32.9*  MCV 95.6  PLT 010*   Basic Metabolic Panel: Recent Labs  Lab 02/04/19 1735  NA 129*  K 4.5  CL 94*  CO2 24  GLUCOSE 130*  BUN 13  CREATININE 0.73  CALCIUM 9.1   GFR: Estimated Creatinine Clearance: 78.4 mL/min (by C-G formula based on SCr of 0.73 mg/dL). Liver Function Tests: Recent Labs  Lab 02/04/19 1735  AST 82*  ALT 81*  ALKPHOS 207*  BILITOT 1.1  PROT 7.4  ALBUMIN 3.7   Recent Labs  Lab 02/04/19 1735  LIPASE 35   No results for input(s): AMMONIA in the last 168 hours. Coagulation Profile: No results for input(s): INR, PROTIME in the last 168 hours. Cardiac Enzymes: No results for input(s): CKTOTAL, CKMB, CKMBINDEX, TROPONINI in the  last 168 hours. BNP (last 3 results) No results for input(s): PROBNP in the last 8760 hours. HbA1C: No results for input(s): HGBA1C in the last 72 hours. CBG: No results for input(s): GLUCAP in the last 168 hours. Lipid Profile: No results for input(s): CHOL, HDL, LDLCALC, TRIG, CHOLHDL, LDLDIRECT in the last 72 hours. Thyroid Function Tests: No results for input(s): TSH, T4TOTAL, FREET4, T3FREE, THYROIDAB in the last 72 hours. Anemia Panel: No results for input(s): VITAMINB12, FOLATE, FERRITIN, TIBC, IRON, RETICCTPCT in the last 72 hours. Urine analysis:    Component Value Date/Time   COLORURINE YELLOW 02/04/2019 1735   APPEARANCEUR CLEAR 02/04/2019 1735   LABSPEC 1.015 02/04/2019 1735   PHURINE 6.0 02/04/2019 1735   GLUCOSEU NEGATIVE 02/04/2019 1735   HGBUR NEGATIVE 02/04/2019 1735   BILIRUBINUR NEGATIVE 02/04/2019 [place] 02/04/2019 1735   PROTEINUR 30 (A) 02/04/2019 1735   NITRITE NEGATIVE 02/04/2019 1735   LEUKOCYTESUR NEGATIVE 02/04/2019 1735    Radiological Exams on Admission: Ct Abdomen Pelvis W Contrast  Result Date: 02/04/2019 CLINICAL DATA:  LEFT total knee arthroplasty 01/14/2019. Acute onset of generalized weakness, generalized abdominal pain, fever and myalgias. Mild hypotension upon EMS arrival. EXAM: CT ABDOMEN AND PELVIS WITH CONTRAST TECHNIQUE: Multidetector CT imaging of the abdomen and pelvis was performed using the standard protocol following bolus administration of intravenous contrast. CONTRAST:  138m OMNIPAQUE IOHEXOL 300 MG/ML IV. COMPARISON:  None. FINDINGS: Lower chest: Minimal scarring in the lingula. Very small (approximate 3 mm) nodule in the LATERAL segment RIGHT MIDDLE LOBE (series 4, image 10). Visualized lung bases otherwise clear. Heart size normal. Mild RIGHT coronary artery atherosclerosis. Hepatobiliary: Mild intra and extrahepatic biliary ductal dilation, the common duct measuring up to 13 mm diameter. Very small (approximate 2  mm) stone in the distal common bile duct, though the duct is dilated beyond this stone, so this stone is not causing obstruction. Multiple small calcified gallstones in the gallbladder, the largest on the order of 6 mm. No evidence of pericholecystic edema or inflammation. No focal hepatic parenchymal abnormalities. Pancreas: Markedly atrophic pancreatic body and tail. Calcifications involving the pancreatic head. No pancreatic mass or peripancreatic inflammation. Spleen: Normal in size and appearance. Adrenals/Urinary Tract: Approximate 1.3 x 1.1 x 0.9 cm low-attenuation nodule involving the LEFT adrenal gland. Normal  appearing RIGHT adrenal gland. Benign simple cyst arising from the ANTERIOR LOWER pole of the RIGHT kidney measuring approximately 6.4 x 5.2 cm. No solid masses involving either kidney. No hydronephrosis. No opaque urinary tract calculi. Normal appearing decompressed urinary bladder. Stomach/Bowel: Moderate-sized hiatal hernia. Stomach decompressed and otherwise normal in appearance. Normal-appearing small bowel. Diffuse colonic diverticulosis, most extensive in the sigmoid region, without evidence of acute diverticulitis. Liquid stool throughout the colon. Mobile cecum positioned in the RIGHT UPPER QUADRANT. Appendix not conspicuous, but no pericecal inflammation. Vascular/Lymphatic: Moderate aorto-iliofemoral atherosclerosis without evidence of aneurysm. Normal-appearing portal venous and systemic venous systems. No pathologic lymphadenopathy. Reproductive: Moderate to marked prostate gland enlargement. Asymmetric enlargement of the LEFT seminal vesicles. Other: Small BILATERAL inguinal hernias containing fat, LEFT larger than RIGHT. Musculoskeletal: Multilevel degenerative disc disease and spondylosis throughout the lumbar spine. Facet degenerative changes throughout the lumbar spine. Desiccated disc extrusion at L5-S1 with INFERIOR migration of the desiccated disc fragment. LOWER thoracic  spondylosis. No acute findings. IMPRESSION: 1. Choledocholithiasis, with a very small (approximate 2 mm) stone in the distal common bile duct; as the duct is dilated distal to this stone, it is not causing obstruction. 2. Cholelithiasis without CT evidence of acute cholecystitis. 3. Moderate-sized hiatal hernia. 4. Diffuse colonic diverticulosis without evidence of acute diverticulitis. Liquid stool throughout the colon. 5. Moderate to marked prostate gland enlargement. Asymmetric enlargement of the LEFT seminal vesicles. Correlate with PSA in order to exclude a prostate malignancy with LEFT seminal vesicle involvement. 6. Small BILATERAL inguinal hernias containing fat, LEFT larger than RIGHT. 7. Likely benign LEFT adrenal adenoma. 8. Very small 3 mm nodule in the LATERAL segment of the visualized RIGHT MIDDLE LOBE. Consider non-emergent CT chest to exclude other nodules elsewhere. Aortic Atherosclerosis (ICD10-I70.0). Electronically Signed   By: Evangeline Dakin M.D.   On: 02/04/2019 19:59    EKG: Not performed.  Assessment/Plan Principal Problem:   Choledocholithiasis Active Problems:   Type 2 diabetes mellitus ([place])   Hypertension associated with diabetes ([place])   Hyperlipidemia associated with type 2 diabetes mellitus ([place])  Timothy Farrell is a 83 y.o. male with medical history significant for type 2 diabetes, hypertension, hyperlipidemia, GERD, and osteoarthritis s/p left TKA on 01/14/2019 who is admitted with choledocholithiasis.   Choledocholithiasis: Approximately 2 mm stone seen in the distal CBD at CT scan.  Cholelithiasis also noted without CT evidence of acute cholecystitis.  EDP discussed with GI who recommended continuing antibiotics and to keep n.p.o. at midnight for potential intervention tomorrow. -Continue Zosyn -N.p.o. at midnight -Antiemetics and pain control as needed -Will obtain blood cultures -Further recommendations per GI  Type 2 diabetes: A1c 6.5% on 01/10/2019.  Home  regimen includes metformin and glyburide.  Will hold home oral hypoglycemics while in hospital. -Sensitive SSI q4h while n.p.o. with hypoglycemic protocol  Hypertension: Currently stable, will continue home Coreg but hold amlodipine and lisinopril for now given reported hypotension and near syncope at home.  Hyperlipidemia: Continue statin.  Osteoarthritis s/p left TKA on 01/14/2019: Appears to be healing well without evidence of cellulitis or ongoing infection at the knee.  Lung nodule seen on CT imaging: 3 mm nodule in the lateral segment of the right middle lobe seen on CT imaging.  Consider nonemergent CT chest to evaluate for presence of hilar nodules.  Enlarged prostate seen on CT: Seen on CT scan with asymmetric enlargement of the left seminal vesicles.  PSA on 12/18/2018 was 1.36.  DVT prophylaxis: SCDs Code Status: Full code, confirmed with  patient Family Communication: Discussed with wife at bedside Disposition Plan: Pending clinical progress Consults called: GI consulted by EDP Admission status: Observation   Zada Finders MD [facility]  If 7PM-7AM, please contact night-coverage [email or web address]  02/04/2019, 9:54 PM

## 2019-02-04 NOTE — ED Provider Notes (Addendum)
[facility] DEPT Provider Note   CSN: [id number] Arrival date & time: 02/04/19  1627     History   Chief Complaint Chief Complaint  Patient presents with  . Abdominal Pain  . Weakness    HPI Timothy Farrell is a 83 y.o. male.     HPI Patient presents with his wife who assists with the HPI. Patient is 3 weeks status post left knee replacement, now presenting with fever, nausea, abdominal pain. Patient notes that he was recovering generally well, though with ongoing soreness, and pain in his knee. About 1 week ago he developed fever, nausea.  With concern for infection he went to another emergency department. There he was started on an antibiotic for reported concern of cellulitis. He notes that he is completed his antibiotic. However, he remains nauseous, with diminished oral intake, persistent anorexia. He did not have abdominal pain until today.  Now over the past few hours he has had multiple episodes of pain both midline and left-sided in his abdomen. Pain is sore, full, not proved with anything. Just prior to EMS notification the patient was attempting to defecate, as he has not had a bowel movement in 2 days. He became weak, and his wife check on him, found him to be diaphoretic, pale, and hypotensive once she took his blood pressure. Patient currently denies any lightheadedness, states that he is somewhat better than during the acute episode. Past Medical History:  Diagnosis Date  . Arthritis   . Diabetes mellitus without complication ([place])   . GERD (gastroesophageal reflux disease)   . Hypertension     Patient Active Problem List   Diagnosis Date Noted  . 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 COLONOSCOPY    . FRACTURE SURGERY Left 2005   plate and screws  . TONSILLECTOMY    . TOTAL KNEE ARTHROPLASTY Left 01/14/2019   Procedure: TOTAL KNEE ARTHROPLASTY;  Surgeon: Vickey Huger, MD;  Location: [facility] ORS;  Service: Orthopedics;  Laterality: Left;  [facility] Kitchen VASCULAR SURGERY  1960   rt leg        Home Medications    Prior to Admission medications   Medication Sig Start Date End Date Taking? Authorizing Provider  acetaminophen (TYLENOL) 500 MG tablet Take 500-1,000 mg by mouth every 6 (six) hours as needed for moderate pain or headache.   Yes [provider]  amLODipine (NORVASC) 5 MG tablet Take 5 mg by mouth daily.   Yes [provider]  aspirin EC 81 MG tablet Take 81 mg by mouth daily.   Yes [provider]  B COMPLEX-ZINC PO Take 1 tablet by mouth daily at 12 noon.   Yes [provider]  carvedilol (COREG) 12.5 MG tablet Take 12.5 mg by mouth 2 (two) times daily with a meal.   Yes [provider]  COD LIVER OIL PO Take 1 capsule by mouth daily at 12 noon.   Yes [provider]  glyBURIDE (DIABETA) 2.5 MG tablet Take 2.5 mg by mouth daily at 12 noon.   Yes [provider]  lisinopril (ZESTRIL) 20 MG tablet Take 10 mg by mouth at bedtime.   Yes [provider]  lovastatin (MEVACOR) 40 MG tablet Take 40 mg by mouth at bedtime.   Yes [provider]  metFORMIN (GLUCOPHAGE) 1000 MG tablet Take 1,000 mg by mouth 2 (two) times a day.   Yes [provider]  methocarbamol (ROBAXIN) 500 MG tablet Take 1-2 tablets (500-1,000 mg total) by mouth every 6 (six) hours as needed for muscle spasms. 01/15/19  Yes Donia Ast, PA  Multiple Vitamins-Minerals (CENTRUM SILVER 50+MEN PO) Take 1 tablet by mouth daily.   Yes [provider]  omeprazole (PRILOSEC OTC) 20 MG tablet Take 20 mg by mouth daily.   Yes [provider]  oxyCODONE (OXY IR/ROXICODONE) 5 MG immediate release tablet Take 1-2 tablets (5-10 mg total) by mouth every 6 (six) hours as needed for moderate pain (pain score 4-6). 01/15/19  Yes Donia Ast, PA  Probiotic CAPS Take 1 capsule by mouth daily.   Yes  [provider]  vitamin C (ASCORBIC ACID) 500 MG tablet Take 500 mg by mouth daily at 12 noon.   Yes [provider]  aspirin EC 325 MG EC tablet Take 1 tablet (325 mg total) by mouth 2 (two) times daily. Patient not taking: Reported on 02/04/2019 01/15/19   Donia Ast, [place]    Family History History reviewed. No pertinent family history.  Social History Social History   Tobacco Use  . Smoking status: Never Smoker  . Smokeless tobacco: Never Used  Substance Use Topics  . Alcohol use: Never    Frequency: Never  . Drug use: Never     Allergies   Patient has no known allergies.   Review of Systems Review of Systems  Constitutional:       Per HPI, otherwise negative  HENT:       Per HPI, otherwise negative  Respiratory:       Per HPI, otherwise negative  Cardiovascular:       Per HPI, otherwise negative  Gastrointestinal: Positive for abdominal pain and nausea. Negative for vomiting.  Endocrine:       Negative aside from HPI  Genitourinary:       Neg aside from HPI   Musculoskeletal:       Per HPI, otherwise negative  Skin: Negative.   Neurological: Positive for weakness and light-headedness. Negative for syncope.     Physical Exam Updated Vital Signs BP (!) 144/70   Pulse 92   Temp 98.1 F (36.7 C) (Oral)   Resp 18   Ht 5\' 11"  (1.803 m)   Wt 81.6 kg   SpO2 97%   BMI 25.10 kg/m   Physical Exam Vitals signs and nursing note reviewed.  Constitutional:      General: He is not in acute distress.    Appearance: He is well-developed.  HENT:     Head: Normocephalic and atraumatic.  Eyes:     Conjunctiva/sclera: Conjunctivae normal.  Cardiovascular:     Rate and Rhythm: Normal rate and regular rhythm.  Pulmonary:     Effort: Pulmonary effort is normal. No respiratory distress.     Breath sounds: No stridor.  Abdominal:     General: There is no distension.     Tenderness: There is generalized abdominal tenderness.   Musculoskeletal:     Comments: Left knee appears appropriate for 3 weeks status post surgery, with minimal erythema, and appreciable suture line, with no sutures in place.  Patient can flex the knee, extended, appropriately.  Skin:    General: Skin is warm and dry.  Neurological:     Mental Status: He is alert and oriented to person, place, and time.      ED Treatments / Results  Labs (all labs ordered are listed, but only abnormal results are displayed)  Labs Reviewed  COMPREHENSIVE METABOLIC PANEL - Abnormal; Notable for the following components:      Result Value   Sodium 129 (*)    Chloride 94 (*)    Glucose, Bld 130 (*)    AST 82 (*)    ALT 81 (*)    Alkaline Phosphatase 207 (*)    All other components within normal limits  CBC WITH DIFFERENTIAL/PLATELET - Abnormal; Notable for the following components:   WBC 15.6 (*)    RBC 3.44 (*)    Hemoglobin 10.9 (*)    HCT 32.9 (*)    Platelets 486 (*)    Neutro Abs 13.5 (*)    Monocytes Absolute 1.2 (*)    Abs Immature Granulocytes 0.11 (*)    All other components within normal limits  URINALYSIS, ROUTINE W REFLEX MICROSCOPIC - Abnormal; Notable for the following components:   Protein, ur 30 (*)    All other components within normal limits  SARS CORONAVIRUS 2 (TAT 6-12 HRS)  LIPASE, BLOOD    EKG None  Radiology Ct Abdomen Pelvis W Contrast  Result Date: 02/04/2019 CLINICAL DATA:  LEFT total knee arthroplasty 01/14/2019. Acute onset of generalized weakness, generalized abdominal pain, fever and myalgias. Mild hypotension upon EMS arrival. EXAM: CT ABDOMEN AND PELVIS WITH CONTRAST TECHNIQUE: Multidetector CT imaging of the abdomen and pelvis was performed using the standard protocol following bolus administration of intravenous contrast. CONTRAST:  172mL OMNIPAQUE IOHEXOL 300 MG/ML IV. COMPARISON:  None. FINDINGS: Lower chest: Minimal scarring in the lingula. Very small (approximate 3 mm) nodule in the LATERAL segment RIGHT  MIDDLE LOBE (series 4, image 10). Visualized lung bases otherwise clear. Heart size normal. Mild RIGHT coronary artery atherosclerosis. Hepatobiliary: Mild intra and extrahepatic biliary ductal dilation, the common duct measuring up to 13 mm diameter. Very small (approximate 2 mm) stone in the distal common bile duct, though the duct is dilated beyond this stone, so this stone is not causing obstruction. Multiple small calcified gallstones in the gallbladder, the largest on the order of 6 mm. No evidence of pericholecystic edema or inflammation. No focal hepatic parenchymal abnormalities. Pancreas: Markedly atrophic pancreatic body and tail. Calcifications involving the pancreatic head. No pancreatic mass or peripancreatic inflammation. Spleen: Normal in size and appearance. Adrenals/Urinary Tract: Approximate 1.3 x 1.1 x 0.9 cm low-attenuation nodule involving the LEFT adrenal gland. Normal appearing RIGHT adrenal gland. Benign simple cyst arising from the ANTERIOR LOWER pole of the RIGHT kidney measuring approximately 6.4 x 5.2 cm. No solid masses involving either kidney. No hydronephrosis. No opaque urinary tract calculi. Normal appearing decompressed urinary bladder. Stomach/Bowel: Moderate-sized hiatal hernia. Stomach decompressed and otherwise normal in appearance. Normal-appearing small bowel. Diffuse colonic diverticulosis, most extensive in the sigmoid region, without evidence of acute diverticulitis. Liquid stool throughout the colon. Mobile cecum positioned in the RIGHT UPPER QUADRANT. Appendix not conspicuous, but no pericecal inflammation. Vascular/Lymphatic: Moderate aorto-iliofemoral atherosclerosis without evidence of aneurysm. Normal-appearing portal venous and systemic venous systems. No pathologic lymphadenopathy. Reproductive: Moderate to marked prostate gland enlargement. Asymmetric enlargement of the LEFT seminal vesicles. Other: Small BILATERAL inguinal hernias containing fat, LEFT larger than  RIGHT. Musculoskeletal: Multilevel degenerative disc disease and spondylosis throughout the lumbar spine. Facet degenerative changes throughout the lumbar spine. Desiccated disc extrusion at L5-S1 with INFERIOR migration of the desiccated disc fragment. LOWER thoracic spondylosis. No acute findings. IMPRESSION: 1. Choledocholithiasis, with a very small (approximate 2 mm) stone in the distal common bile duct; as the duct is dilated distal to this  stone, it is not causing obstruction. 2. Cholelithiasis without CT evidence of acute cholecystitis. 3. Moderate-sized hiatal hernia. 4. Diffuse colonic diverticulosis without evidence of acute diverticulitis. Liquid stool throughout the colon. 5. Moderate to marked prostate gland enlargement. Asymmetric enlargement of the LEFT seminal vesicles. Correlate with PSA in order to exclude a prostate malignancy with LEFT seminal vesicle involvement. 6. Small BILATERAL inguinal hernias containing fat, LEFT larger than RIGHT. 7. Likely benign LEFT adrenal adenoma. 8. Very small 3 mm nodule in the LATERAL segment of the visualized RIGHT MIDDLE LOBE. Consider non-emergent CT chest to exclude other nodules elsewhere. Aortic Atherosclerosis (ICD10-I70.0). Electronically Signed   By: Evangeline Dakin M.D.   On: 02/04/2019 19:59    Procedures Procedures (including critical care time)  Medications Ordered in ED Medications  0.9 %  sodium chloride infusion ( Intravenous Stopped 02/04/19 1902)  piperacillin-tazobactam (ZOSYN) IVPB 3.375 g (has no administration in time range)  ondansetron (ZOFRAN) injection 4 mg (4 mg Intravenous Given 02/04/19 1749)  iohexol (OMNIPAQUE) 300 MG/ML solution 100 mL (100 mLs Intravenous Contrast Given 02/04/19 1933)  sodium chloride (PF) 0.9 % injection (  Given by Other 02/04/19 2003)     Initial Impression / Assessment and Plan / ED Course  I have reviewed the triage vital signs and the nursing notes.  Pertinent labs & imaging results that were  available during my care of the patient were reviewed by me and considered in my medical decision making (see chart for details).        9:01 PM Patient continues to complain of nausea. Review reviewed the findings of labs, CT with him and his wife purulent findings notable for hyponatremia, slight elevation in LFT, and most notably, CT evidence concerning for choledocholithiasis. Patient will require initiation of antibiotics, ongoing fluid resuscitation, admission for consideration of additional studies in the a.m.  10:01 PM I discussed the patient's presentation with Dr. Benson Norway, our GI colleague.  ABX should continue and patient will be NPO after midnight for possible procedure in the AM.  Final Clinical Impressions(s) / ED Diagnoses   Final diagnoses:  Choledocholithiasis  Hyponatremia     Carmin Muskrat, MD 02/04/19 2102    Carmin Muskrat, MD 02/04/19 2203

## 2019-02-05 ENCOUNTER — Encounter (HOSPITAL_COMMUNITY)
Admission: EM | Disposition: A | Discharge status: Home / Self Care | Payer: Self-pay | Source: Home / Self Care | Attending: Internal Medicine

## 2019-02-05 ENCOUNTER — Observation Stay (HOSPITAL_COMMUNITY): Payer: PPO | Admitting: Certified Registered"

## 2019-02-05 ENCOUNTER — Observation Stay (HOSPITAL_COMMUNITY): Payer: PPO

## 2019-02-05 ENCOUNTER — Other Ambulatory Visit: Payer: Self-pay

## 2019-02-05 DIAGNOSIS — Z8719 Personal history of other diseases of the digestive system: Secondary | ICD-10-CM | POA: Diagnosis not present

## 2019-02-05 HISTORY — PX: ERCP: SHX5425

## 2019-02-05 HISTORY — PX: SPHINCTEROTOMY: SHX5544

## 2019-02-05 LAB — CBC
HCT: 29.1 % — ABNORMAL LOW (ref 39.0–52.0)
Hemoglobin: 9.7 g/dL — ABNORMAL LOW (ref 13.0–17.0)
MCH: 32 pg (ref 26.0–34.0)
MCHC: 33.3 g/dL (ref 30.0–36.0)
MCV: 96 fL (ref 80.0–100.0)
Platelets: 482 10*3/uL — ABNORMAL HIGH (ref 150–400)
RBC: 3.03 MIL/uL — ABNORMAL LOW (ref 4.22–5.81)
RDW: 13.2 % (ref 11.5–15.5)
WBC: 11.1 10*3/uL — ABNORMAL HIGH (ref 4.0–10.5)
nRBC: 0 % (ref 0.0–0.2)

## 2019-02-05 LAB — COMPREHENSIVE METABOLIC PANEL
ALT: 66 U/L — ABNORMAL HIGH (ref 0–44)
AST: 45 U/L — ABNORMAL HIGH (ref 15–41)
Albumin: 3.2 g/dL — ABNORMAL LOW (ref 3.5–5.0)
Alkaline Phosphatase: 177 U/L — ABNORMAL HIGH (ref 38–126)
Anion gap: 13 (ref 5–15)
BUN: 11 mg/dL (ref 8–23)
CO2: 21 mmol/L — ABNORMAL LOW (ref 22–32)
Calcium: 8.2 mg/dL — ABNORMAL LOW (ref 8.9–10.3)
Chloride: 92 mmol/L — ABNORMAL LOW (ref 98–111)
Creatinine, Ser: 0.77 mg/dL (ref 0.61–1.24)
GFR calc Af Amer: >60 mL/min (ref >60)
GFR calc non Af Amer: >60 mL/min (ref >60)
Glucose, Bld: 111 mg/dL — ABNORMAL HIGH (ref 70–99)
Potassium: 4.1 mmol/L (ref 3.5–5.1)
Sodium: 126 mmol/L — ABNORMAL LOW (ref 135–145)
Total Bilirubin: 0.9 mg/dL (ref 0.3–1.2)
Total Protein: 6.6 g/dL (ref 6.5–8.1)

## 2019-02-05 LAB — GLUCOSE, CAPILLARY
Glucose-Capillary: 87 mg/dL (ref 70–99)
Glucose-Capillary: 101 mg/dL — ABNORMAL HIGH (ref 70–99)
Glucose-Capillary: 109 mg/dL — ABNORMAL HIGH (ref 70–99)
Glucose-Capillary: 130 mg/dL — ABNORMAL HIGH (ref 70–99)
Glucose-Capillary: 177 mg/dL — ABNORMAL HIGH (ref 70–99)

## 2019-02-05 LAB — SARS CORONAVIRUS 2 (TAT 6-24 HRS): SARS Coronavirus 2: NEGATIVE

## 2019-02-05 LAB — APTT: aPTT: 31 seconds (ref 24–36)

## 2019-02-05 LAB — PROTIME-INR
INR: 1 (ref 0.8–1.2)
Prothrombin Time: 13.5 seconds (ref 11.4–15.2)

## 2019-02-05 SURGERY — ERCP, WITH INTERVENTION IF INDICATED
Anesthesia: General

## 2019-02-05 MED ORDER — DEXAMETHASONE SODIUM PHOSPHATE 10 MG/ML IJ SOLN
INTRAMUSCULAR | Status: DC | PRN
  Administered 2019-02-05: 5 mg via INTRAVENOUS

## 2019-02-05 MED ORDER — ROCURONIUM BROMIDE 10 MG/ML (PF) SYRINGE
PREFILLED_SYRINGE | INTRAVENOUS | Status: DC | PRN
  Administered 2019-02-05: 50 mg via INTRAVENOUS

## 2019-02-05 MED ORDER — SODIUM CHLORIDE 0.9 % IV SOLN: INTRAVENOUS | Status: DC

## 2019-02-05 MED ORDER — LACTATED RINGERS IV SOLN
INTRAVENOUS | Status: DC
  Administered 2019-02-05: 13:00:00 via INTRAVENOUS

## 2019-02-05 MED ORDER — SODIUM CHLORIDE 0.9 % IV SOLN
1.5000 g | Freq: Four times a day (QID) | INTRAVENOUS | Status: DC
  Administered 2019-02-05 – 2019-02-07 (×8): 1.5 g via INTRAVENOUS
  Filled 2019-02-05: qty 4
  Filled 2019-02-05 (×7): qty 1.5
  Filled 2019-02-05 (×2): qty 4
  Filled 2019-02-05: qty 1.5

## 2019-02-05 MED ORDER — SUGAMMADEX SODIUM 500 MG/5ML IV SOLN
INTRAVENOUS | Status: DC | PRN
  Administered 2019-02-05: 300 mg via INTRAVENOUS

## 2019-02-05 MED ORDER — PHENOL 1.4 % MT LIQD
1.0000 | OROMUCOSAL | Status: DC | PRN
  Administered 2019-02-05: 18:00:00 1 via OROMUCOSAL
  Filled 2019-02-05: qty 177

## 2019-02-05 MED ORDER — PROPOFOL 10 MG/ML IV BOLUS
INTRAVENOUS | Status: DC | PRN
  Administered 2019-02-05: 120 mg via INTRAVENOUS

## 2019-02-05 MED ORDER — FENTANYL CITRATE (PF) 100 MCG/2ML IJ SOLN
INTRAMUSCULAR | Status: AC
  Filled 2019-02-05: qty 2

## 2019-02-05 MED ORDER — SODIUM CHLORIDE 0.9 % IV SOLN
INTRAVENOUS | Status: DC | PRN
  Administered 2019-02-05: 30 mL

## 2019-02-05 MED ORDER — PIPERACILLIN-TAZOBACTAM 3.375 G IVPB
3.3750 g | Freq: Three times a day (TID) | INTRAVENOUS | Status: DC
  Administered 2019-02-05: 3.375 g via INTRAVENOUS
  Filled 2019-02-05: qty 50

## 2019-02-05 MED ORDER — FENTANYL CITRATE (PF) 250 MCG/5ML IJ SOLN
INTRAMUSCULAR | Status: DC | PRN
  Administered 2019-02-05: 50 ug via INTRAVENOUS

## 2019-02-05 MED ORDER — ONDANSETRON HCL 4 MG/2ML IJ SOLN
INTRAMUSCULAR | Status: DC | PRN
  Administered 2019-02-05: 4 mg via INTRAVENOUS

## 2019-02-05 MED ORDER — LIDOCAINE 2% (20 MG/ML) 5 ML SYRINGE
INTRAMUSCULAR | Status: DC | PRN
  Administered 2019-02-05: 60 mg via INTRAVENOUS

## 2019-02-05 NOTE — Anesthesia Procedure Notes (Signed)
Date/Time: 02/05/2019 3:06 PM Performed by: Cynda Familia, CRNA Oxygen Delivery Method: Simple face mask Placement Confirmation: positive ETCO2 and breath sounds checked- equal and bilateral Dental Injury: Teeth and Oropharynx as per pre-operative assessment

## 2019-02-05 NOTE — Progress Notes (Signed)
Pharmacy Antibiotic Note  Timothy Farrell is a 83 y.o. male admitted on 02/04/2019 with Intra-abdominal infection.  Pharmacy has been consulted for zosyn dosing.  Plan: Zosyn 3.375g IV q8h (4 hour infusion).  Height: 5\' 11"  (180.3 cm) Weight: 180 lb (81.6 kg) IBW/kg (Calculated) : 75.3  Temp (24hrs), Avg:98.9 F (37.2 C), Min:98.1 F (36.7 C), Max:99.7 F (37.6 C)  Recent Labs  Lab 02/04/19 1735 02/05/19 0030  WBC 15.6* 11.1*  CREATININE 0.73 0.77    Estimated Creatinine Clearance: 78.4 mL/min (by C-G formula based on SCr of 0.77 mg/dL).    No Known Allergies  Antimicrobials this admission: Zosyn 02/04/2019 >>   Dose adjustments this admission: -  Microbiology results: -  Thank you for allowing pharmacy to be a part of this patient's care.  Timothy Farrell 02/05/2019 4:13 AM

## 2019-02-05 NOTE — Transfer of Care (Signed)
Immediate Anesthesia Transfer of Care Note  Patient: Timothy Farrell  Procedure(s) Performed: ENDOSCOPIC RETROGRADE CHOLANGIOPANCREATOGRAPHY (ERCP) (N/A ) SPHINCTEROTOMY  Patient Location: PACU and Endoscopy Unit  Anesthesia Type:General  Level of Consciousness: awake and alert   Airway & Oxygen Therapy: Patient Spontanous Breathing and Patient connected to face mask oxygen  Post-op Assessment: Report given to RN and Post -op Vital signs reviewed and stable  Post vital signs: Reviewed and stable  Last Vitals:  Vitals Value Taken Time  BP 160/62 02/05/19 1514  Temp 36.8 C 02/05/19 1514  Pulse 75 02/05/19 1514  Resp 17 02/05/19 1514  SpO2 100 % 02/05/19 1514    Last Pain:  Vitals:   02/05/19 1514  TempSrc: Temporal  PainSc: 1          Complications: No apparent anesthesia complications

## 2019-02-05 NOTE — Anesthesia Preprocedure Evaluation (Addendum)
Anesthesia Evaluation  Patient identified by MRN, date of birth, ID band Patient awake    Reviewed: Allergy & Precautions, NPO status , Patient's Chart, lab work & pertinent test results  Airway Mallampati: I  TM Distance: >3 FB Neck ROM: Full    Dental no notable dental hx. (+) Teeth Intact   Pulmonary neg pulmonary ROS,    Pulmonary exam normal breath sounds clear to auscultation       Cardiovascular Exercise Tolerance: Good hypertension, Pt. on medications and Pt. on home beta blockers Normal cardiovascular exam Rhythm:Regular Rate:Normal     Neuro/Psych negative neurological ROS  negative psych ROS   GI/Hepatic GERD  ,  Endo/Other  diabetes, Type 2  Renal/GU      Musculoskeletal  (+) Arthritis ,   Abdominal   Peds  Hematology  (+) anemia , Hgb 9.7 Plt 482   Anesthesia Other Findings   Reproductive/Obstetrics                             Anesthesia Physical Anesthesia Plan  ASA: III  Anesthesia Plan: General   Post-op Pain Management:    Induction: Intravenous  PONV Risk Score and Plan: 3 and Treatment may vary due to age or medical condition, Dexamethasone and Ondansetron  Airway Management Planned: Oral ETT  Additional Equipment:   Intra-op Plan:   Post-operative Plan: Extubation in OR  Informed Consent:     Dental advisory given  Plan Discussed with:   Anesthesia Plan Comments:         Anesthesia Quick Evaluation

## 2019-02-05 NOTE — Consult Note (Signed)
Reason for Consult: Choledocholithiasis Referring Physician: [facility]  Timothy Farrell HPI: This is an 83 year male with a PMH of HTN, hyperlipidemia, GERD, and DM admitted for complaints of abdominal pain.  The pain was located in the epigastric region and further work up in the ER revealed choledocholithiasis.  A 2 mm stone was noted in the distal common bile duct and there was an elevation in his liver enzymes.  Cholelithiasis and CBD dilation were also noted, but there was no evidence of a cholecystitis.  The patient was complaining about nausea for the past couple of weeks and he felt that it was as a result of his left TKA on 01/14/2019.  It was only when he developed the epigastric pain with an associated near syncope episode that he presented to the ER.  Past Medical History:  Diagnosis Date  . Arthritis   . Diabetes mellitus without complication ([place])   . GERD (gastroesophageal reflux disease)   . Hypertension     Past Surgical History:  Procedure Laterality Date  . COLONOSCOPY    . FRACTURE SURGERY Left 2005   plate and screws  . TONSILLECTOMY    . TOTAL KNEE ARTHROPLASTY Left 01/14/2019   Procedure: TOTAL KNEE ARTHROPLASTY;  Surgeon: Vickey Huger, MD;  Location: [facility];  Service: Orthopedics;  Laterality: Left;  Marland Kitchen VASCULAR SURGERY  1960   rt leg    Family History  Problem Relation Age of Onset  . Diabetes Mother   . Hypertension Father   . Heart disease Father     Social History:  reports that he has never smoked. He has never used smokeless tobacco. He reports that he does not drink alcohol or use drugs.  Allergies: No Known Allergies  Medications:  Scheduled: . [MAR Hold] carvedilol  12.5 mg Oral BID WC  . [MAR Hold] insulin aspart  0-9 Units Subcutaneous Q4H  . [MAR Hold] pantoprazole  40 mg Oral Daily  . [MAR Hold] pravastatin  40 mg Oral q1800   Continuous: . [MAR Hold] ampicillin-sulbactam (UNASYN) IV 1.5 g (02/05/19 1121)  . lactated ringers 20  mL/hr at 02/05/19 1304    Results for orders placed or performed during the hospital encounter of 02/04/19 (from the past 24 hour(s))  Comprehensive metabolic panel     Status: Abnormal   Collection Time: 02/04/19  5:35 PM  Result Value Ref Range   Sodium 129 (L) 135 - 145 mmol/L   Potassium 4.5 3.5 - 5.1 mmol/L   Chloride 94 (L) 98 - 111 mmol/L   CO2 24 22 - 32 mmol/L   Glucose, Bld 130 (H) 70 - 99 mg/dL   BUN 13 8 - 23 mg/dL   Creatinine, Ser 0.73 0.61 - 1.24 mg/dL   Calcium 9.1 8.9 - 10.3 mg/dL   Total Protein 7.4 6.5 - 8.1 g/dL   Albumin 3.7 3.5 - 5.0 g/dL   AST 82 (H) 15 - 41 U/L   ALT 81 (H) 0 - 44 U/L   Alkaline Phosphatase 207 (H) 38 - 126 U/L   Total Bilirubin 1.1 0.3 - 1.2 mg/dL   GFR calc non Af Amer >60 >60 mL/min   GFR calc Af Amer >60 >60 mL/min   Anion gap 11 5 - 15  Lipase, blood     Status: None   Collection Time: 02/04/19  5:35 PM  Result Value Ref Range   Lipase 35 11 - 51 U/L  CBC WITH DIFFERENTIAL     Status:  Abnormal   Collection Time: 02/04/19  5:35 PM  Result Value Ref Range   WBC 15.6 (H) 4.0 - 10.5 K/uL   RBC 3.44 (L) 4.22 - 5.81 MIL/uL   Hemoglobin 10.9 (L) 13.0 - 17.0 g/dL   HCT 32.9 (L) 39.0 - 52.0 %   MCV 95.6 80.0 - 100.0 fL   MCH 31.7 26.0 - 34.0 pg   MCHC 33.1 30.0 - 36.0 g/dL   RDW 13.0 11.5 - 15.5 %   Platelets 486 (H) 150 - 400 K/uL   nRBC 0.0 0.0 - 0.2 %   Neutrophils Relative % 87 %   Neutro Abs 13.5 (H) 1.7 - 7.7 K/uL   Lymphocytes Relative 4 %   Lymphs Abs 0.7 0.7 - 4.0 K/uL   Monocytes Relative 8 %   Monocytes Absolute 1.2 (H) 0.1 - 1.0 K/uL   Eosinophils Relative 0 %   Eosinophils Absolute 0.0 0.0 - 0.5 K/uL   Basophils Relative 0 %   Basophils Absolute 0.0 0.0 - 0.1 K/uL   Immature Granulocytes 1 %   Abs Immature Granulocytes 0.11 (H) 0.00 - 0.07 K/uL  Urinalysis, Routine w reflex microscopic     Status: Abnormal   Collection Time: 02/04/19  5:35 PM  Result Value Ref Range   Color, Urine YELLOW YELLOW   APPearance  CLEAR CLEAR   Specific Gravity, Urine 1.015 1.005 - 1.030   pH 6.0 5.0 - 8.0   Glucose, UA NEGATIVE NEGATIVE mg/dL   Hgb urine dipstick NEGATIVE NEGATIVE   Bilirubin Urine NEGATIVE NEGATIVE   Ketones, ur NEGATIVE NEGATIVE mg/dL   Protein, ur 30 (A) NEGATIVE mg/dL   Nitrite NEGATIVE NEGATIVE   Leukocytes,Ua NEGATIVE NEGATIVE   RBC / HPF 0-5 0 - 5 RBC/hpf   WBC, UA 0-5 0 - 5 WBC/hpf   Bacteria, UA NONE SEEN NONE SEEN   Mucus PRESENT    Hyaline Casts, UA PRESENT   SARS CORONAVIRUS 2 (TAT 6-12 HRS) Nasal Swab Aptima Multi Swab     Status: None   Collection Time: 02/04/19  9:21 PM   Specimen: Aptima Multi Swab; Nasal Swab  Result Value Ref Range   SARS Coronavirus 2 NEGATIVE NEGATIVE  Glucose, capillary     Status: Abnormal   Collection Time: 02/04/19 11:57 PM  Result Value Ref Range   Glucose-Capillary 107 (H) 70 - 99 mg/dL  Comprehensive metabolic panel     Status: Abnormal   Collection Time: 02/05/19 12:30 AM  Result Value Ref Range   Sodium 126 (L) 135 - 145 mmol/L   Potassium 4.1 3.5 - 5.1 mmol/L   Chloride 92 (L) 98 - 111 mmol/L   CO2 21 (L) 22 - 32 mmol/L   Glucose, Bld 111 (H) 70 - 99 mg/dL   BUN 11 8 - 23 mg/dL   Creatinine, Ser 0.77 0.61 - 1.24 mg/dL   Calcium 8.2 (L) 8.9 - 10.3 mg/dL   Total Protein 6.6 6.5 - 8.1 g/dL   Albumin 3.2 (L) 3.5 - 5.0 g/dL   AST 45 (H) 15 - 41 U/L   ALT 66 (H) 0 - 44 U/L   Alkaline Phosphatase 177 (H) 38 - 126 U/L   Total Bilirubin 0.9 0.3 - 1.2 mg/dL   GFR calc non Af Amer >60 >60 mL/min   GFR calc Af Amer >60 >60 mL/min   Anion gap 13 5 - 15  CBC     Status: Abnormal   Collection Time: 02/05/19 12:30 AM  Result Value  Ref Range   WBC 11.1 (H) 4.0 - 10.5 K/uL   RBC 3.03 (L) 4.22 - 5.81 MIL/uL   Hemoglobin 9.7 (L) 13.0 - 17.0 g/dL   HCT 29.1 (L) 39.0 - 52.0 %   MCV 96.0 80.0 - 100.0 fL   MCH 32.0 26.0 - 34.0 pg   MCHC 33.3 30.0 - 36.0 g/dL   RDW 13.2 11.5 - 15.5 %   Platelets 482 (H) 150 - 400 K/uL   nRBC 0.0 0.0 - 0.2 %   Protime-INR     Status: None   Collection Time: 02/05/19 12:30 AM  Result Value Ref Range   Prothrombin Time 13.5 11.4 - 15.2 seconds   INR 1.0 0.8 - 1.2  APTT     Status: None   Collection Time: 02/05/19 12:30 AM  Result Value Ref Range   aPTT 31 24 - 36 seconds  Glucose, capillary     Status: None   Collection Time: 02/05/19  5:06 AM  Result Value Ref Range   Glucose-Capillary 87 70 - 99 mg/dL  Glucose, capillary     Status: Abnormal   Collection Time: 02/05/19  7:43 AM  Result Value Ref Range   Glucose-Capillary 101 (H) 70 - 99 mg/dL  Glucose, capillary     Status: Abnormal   Collection Time: 02/05/19  1:03 PM  Result Value Ref Range   Glucose-Capillary 109 (H) 70 - 99 mg/dL     Ct Abdomen Pelvis W Contrast  Result Date: 02/04/2019 CLINICAL DATA:  LEFT total knee arthroplasty 01/14/2019. Acute onset of generalized weakness, generalized abdominal pain, fever and myalgias. Mild hypotension upon EMS arrival. EXAM: CT ABDOMEN AND PELVIS WITH CONTRAST TECHNIQUE: Multidetector CT imaging of the abdomen and pelvis was performed using the standard protocol following bolus administration of intravenous contrast. CONTRAST:  149mL OMNIPAQUE IOHEXOL 300 MG/ML IV. COMPARISON:  None. FINDINGS: Lower chest: Minimal scarring in the lingula. Very small (approximate 3 mm) nodule in the LATERAL segment RIGHT MIDDLE LOBE (series 4, image 10). Visualized lung bases otherwise clear. Heart size normal. Mild RIGHT coronary artery atherosclerosis. Hepatobiliary: Mild intra and extrahepatic biliary ductal dilation, the common duct measuring up to 13 mm diameter. Very small (approximate 2 mm) stone in the distal common bile duct, though the duct is dilated beyond this stone, so this stone is not causing obstruction. Multiple small calcified gallstones in the gallbladder, the largest on the order of 6 mm. No evidence of pericholecystic edema or inflammation. No focal hepatic parenchymal abnormalities. Pancreas:  Markedly atrophic pancreatic body and tail. Calcifications involving the pancreatic head. No pancreatic mass or peripancreatic inflammation. Spleen: Normal in size and appearance. Adrenals/Urinary Tract: Approximate 1.3 x 1.1 x 0.9 cm low-attenuation nodule involving the LEFT adrenal gland. Normal appearing RIGHT adrenal gland. Benign simple cyst arising from the ANTERIOR LOWER pole of the RIGHT kidney measuring approximately 6.4 x 5.2 cm. No solid masses involving either kidney. No hydronephrosis. No opaque urinary tract calculi. Normal appearing decompressed urinary bladder. Stomach/Bowel: Moderate-sized hiatal hernia. Stomach decompressed and otherwise normal in appearance. Normal-appearing small bowel. Diffuse colonic diverticulosis, most extensive in the sigmoid region, without evidence of acute diverticulitis. Liquid stool throughout the colon. Mobile cecum positioned in the RIGHT UPPER QUADRANT. Appendix not conspicuous, but no pericecal inflammation. Vascular/Lymphatic: Moderate aorto-iliofemoral atherosclerosis without evidence of aneurysm. Normal-appearing portal venous and systemic venous systems. No pathologic lymphadenopathy. Reproductive: Moderate to marked prostate gland enlargement. Asymmetric enlargement of the LEFT seminal vesicles. Other: Small BILATERAL inguinal hernias containing fat, LEFT  larger than RIGHT. Musculoskeletal: Multilevel degenerative disc disease and spondylosis throughout the lumbar spine. Facet degenerative changes throughout the lumbar spine. Desiccated disc extrusion at L5-S1 with INFERIOR migration of the desiccated disc fragment. LOWER thoracic spondylosis. No acute findings. IMPRESSION: 1. Choledocholithiasis, with a very small (approximate 2 mm) stone in the distal common bile duct; as the duct is dilated distal to this stone, it is not causing obstruction. 2. Cholelithiasis without CT evidence of acute cholecystitis. 3. Moderate-sized hiatal hernia. 4. Diffuse colonic  diverticulosis without evidence of acute diverticulitis. Liquid stool throughout the colon. 5. Moderate to marked prostate gland enlargement. Asymmetric enlargement of the LEFT seminal vesicles. Correlate with PSA in order to exclude a prostate malignancy with LEFT seminal vesicle involvement. 6. Small BILATERAL inguinal hernias containing fat, LEFT larger than RIGHT. 7. Likely benign LEFT adrenal adenoma. 8. Very small 3 mm nodule in the LATERAL segment of the visualized RIGHT MIDDLE LOBE. Consider non-emergent CT chest to exclude other nodules elsewhere. Aortic Atherosclerosis (ICD10-I70.0). Electronically Signed   By: Evangeline Dakin M.D.   On: 02/04/2019 19:59    ROS:  As stated above in the HPI otherwise negative.  Blood pressure (!) 135/58, pulse 66, temperature 98.3 F (36.8 C), temperature source Oral, resp. rate 13, height 5\' 11"  (1.803 m), weight 84.2 kg, SpO2 98 %.    PE: Gen: NAD, Alert and Oriented HEENT:  [place]/AT, EOMI Neck: Supple, no LAD Lungs: CTA Bilaterally CV: RRR without M/G/R ABM: Soft, NTND, +BS Ext: No C/C/E  Assessment/Plan: 1) Choledocholithiasis. 2) Possible ascending cholangitis. 3) Cholelithiasis.   The patient's WBC has declined.  Further evaluation and treatment with an ERCP will be performed.  The risks of bleeding, infection, perforation, and pancreatitis were discussed with the patient.  He wishes to proceed with the intervention.  Bruce Churilla D 02/05/2019, 1:51 PM

## 2019-02-05 NOTE — Op Note (Signed)
[facility] Patient Name: Timothy Farrell Procedure Date: 02/05/2019 MRN: [id number] Attending MD: Carol Ada , MD Date of Birth: 29-Nov-1935 CSN: [id number] Age: 83 Admit Type: Inpatient Procedure:                ERCP Indications:              Common bile duct stone(s) Providers:                Carol Ada, MD, Cleda Daub, RN, Cherylynn Ridges,                            Technician Referring MD:              Medicines:                General Anesthesia Complications:            No immediate complications. Estimated Blood Loss:     Estimated blood loss: none. Procedure:                Pre-Anesthesia Assessment:                           - Prior to the procedure, a History and Physical                            was performed, and patient medications and                            allergies were reviewed. The patient's tolerance of                            previous anesthesia was also reviewed. The risks                            and benefits of the procedure and the sedation                            options and risks were discussed with the patient.                            All questions were answered, and informed consent                            was obtained. Prior Anticoagulants: The patient has                            taken no previous anticoagulant or antiplatelet                            agents. ASA Grade Assessment: II - A patient with                            mild systemic disease. After reviewing the risks                            and benefits,  the patient was deemed in                            satisfactory condition to undergo the procedure.                           - Sedation was administered by an anesthesia                            professional. General anesthesia was attained.                           After obtaining informed consent, the scope was                            passed under direct vision. Throughout the                procedure, the patient's blood pressure, pulse, and                            oxygen saturations were monitored continuously. The                            [id number] Olympus duodenoscope was                            introduced through the mouth, and used to inject                            contrast into and used to inject contrast into the                            bile duct. The ERCP was accomplished without                            difficulty. The patient tolerated the procedure                            well. Scope In: Scope Out: Findings:      The major papilla was normal. The bile duct was deeply cannulated with       the short-nosed traction sphincterotome. Contrast was injected. I       personally interpreted the bile duct images. There was brisk flow of       contrast through the ducts. Image quality was excellent. Contrast       extended to the main bile duct. The main bile duct was moderately       dilated and diffusely dilated, uncertain etiology. The largest diameter       was 10 mm. A short 0.035 inch Soft Jagwire was passed into the biliary       tree. A 15 mm biliary sphincterotomy was made with a traction (standard)       sphincterotome using ERBE electrocautery. There was no       post-sphincterotomy bleeding. The biliary tree was swept with a 15 mm       balloon starting at  the bifurcation. Debris was swept from the duct.      Cannulation of the CBD was achieved during the first attempt. There was       no manipulation of the PD. The guidewire was secured in the left       intrahepatic ducts. Contrast injection did not reveal any filling       defects, but the CBD was dilated up to 10 mm. A generous sphincterotomy       was created and there was evidence of debris escaping out of the CBD.       The CBD was swept 5 times and the occlusion cholangiogram was negative       for any retained stones. No stones were extracted. Impression:                - The major papilla appeared normal.                           - The entire main bile duct was moderately dilated,                            uncertain etiology.                           - A biliary sphincterotomy was performed.                           - The biliary tree was swept and debris was found. Moderate Sedation:      Not Applicable - Patient had care per Anesthesia. Recommendation:           - Return patient to hospital ward for ongoing care.                           - Clear liquid diet.                           - Surgical consultation for lap chole, inpatient                            versus outpatient.                           - Continue Unasyn until tomorrow AM. Procedure Code(s):        --- Professional ---                           [phone], Endoscopic retrograde                            cholangiopancreatography (ERCP); with removal of                            calculi/debris from biliary/pancreatic duct(s)                           [id number], Endoscopic retrograde  cholangiopancreatography (ERCP); with                            sphincterotomy/papillotomy                           [phone], Endoscopic catheterization of the biliary                            ductal system, radiological supervision and                            interpretation Diagnosis Code(s):        --- Professional ---                           K80.50, Calculus of bile duct without cholangitis                            or cholecystitis without obstruction                           K83.8, Other specified diseases of biliary tract CPT copyright 2019 [employer]. All rights reserved. The codes documented in this report are preliminary and upon coder review may  be revised to meet current compliance requirements. Carol Ada, MD Carol Ada, MD 02/05/2019 3:04:34 PM This report has been signed electronically. Number of Addenda: 0

## 2019-02-05 NOTE — Progress Notes (Signed)
PROGRESS NOTE    Timothy Farrell  [id number] DOB: 1935/07/25 DOA: 02/04/2019 PCP: Timothy Broker, MD      Brief Narrative:  Mr. Timothy Farrell is a 83 y.o. M with hx HTN, DM and OA with recent left TKA who presented with few weeks nausea, anorexia and sweats.  6 days ago, presented to OSH with fever, sweats, diagnosed with cellulitis, discharged with cephalexin.  Since then, developed new abdominal aching pain and lightheadedness, and so came to ER.  In the ER, CT showed a choledocholithiasis and leukocytosis. Started on antibiotics.  GI were consulted who recommended ERCP.     Assessment & Plan:  Choledocholithiasis Suspicion for cholangitis Patient has had fevers, sweats, elevated transaminases, right upper quadrant pain, and leukocytosis. -Bowel rest -IV fluids -IV morphine for pain, Zofran for nausea -Continue antibiotics, narrowed to Unasyn -Consult to GI, appreciate expert cares -Hopefully ERCP today  -General surgery have been consulted to discuss timing of cholecystectomy with patient   Hypertension Pressure well controlled -Continue carvedilol - Hold amlodipine, lisinopril  Diabetes Blood glucose well controlled overnight - Continue sliding scale corrections -Continue statin -Hold metformin  Hyponatremia Mild -Trend BMP  Normocytic anemia No clinical blood loss.  Baseline hemoglobin unknown.  Hemoglobin 12.1 and admission.      MDM and disposition: The below labs and imaging reports were reviewed and summarized above.  Medication management as above.  The patient was admitted with abdominal pain and fever, found to have choledocholithiasis, transaminitis and leukocytosis.  He will need operative stone removal and continued IV antibiotics.         DVT prophylaxis: SCDs Code Status: FULL Family Communication: Wife    Consultants:   GI, Dr. Benson Farrell  Procedures:   ERCP planned for 8/25  Antimicrobials:   Zosyn 8/24 x2  Unasyn 8/25 >>     Subjective: Abdominal pain is controlled by medicine.  Nausea persists.  Bloating persists.  No confusion, fever, vomiting.  No diarrhea.  No chest pain.  Objective: Vitals:   02/04/19 1815 02/04/19 2341 02/05/19 0502 02/05/19 0948  BP: (!) 144/70 127/71 126/68 (!) 129/107  Pulse: 92 85 74 66  Resp: 18 17 14 18   Temp:  99.7 F (37.6 C) 98.8 F (37.1 C) 98.3 F (36.8 C)  TempSrc:  Oral Oral Oral  SpO2: 97% 97% 99% 98%  Weight:   84.2 kg   Height:        Intake/Output Summary (Last 24 hours) at 02/05/2019 1125 Last data filed at 02/05/2019 1033 Gross per 24 hour  Intake 2169.1 ml  Output 1650 ml  Net 519.1 ml   Filed Weights   02/04/19 1649 02/05/19 0502  Weight: 81.6 kg 84.2 kg    Examination: General appearance: older adult male, alert and in no acute distress.  Lying in bed. HEENT: Anicteric, conjunctiva pink, lids and lashes normal. No nasal deformity, discharge, epistaxis.  Lips moist, dentition normal, OP moist, no oral lesions.   Skin: Warm and dry.  No jaundice.  No suspicious rashes or lesions. Cardiac: RRR, nl S1-S2, no murmurs appreciated.  Capillary refill is brisk.  JVP not visible.  No LE edema.  Radia pulses 2+ and symmetric. Respiratory: Normal respiratory rate and rhythm.  CTAB without rales or wheezes. Abdomen: Abdomen soft.  Mild epigastric TTP, mild RUQ tenderness, no guarding at all. No ascites, distension, hepatosplenomegaly.   MSK: No deformities or effusions. Neuro: Awake and alert.  EOMI, moves all extremities. Speech fluent.    Psych: Sensorium  intact and responding to questions, attention normal. Affect normal.  Judgment and insight appear normal.    Data Reviewed: I have personally reviewed following labs and imaging studies:  CBC: Recent Labs  Lab 02/04/19 1735 02/05/19 0030  WBC 15.6* 11.1*  NEUTROABS 13.5*  --   HGB 10.9* 9.7*  HCT 32.9* 29.1*  MCV 95.6 96.0  PLT 486* 123XX123*   Basic Metabolic Panel: Recent Labs  Lab  02/04/19 1735 02/05/19 0030  NA 129* 126*  K 4.5 4.1  CL 94* 92*  CO2 24 21*  GLUCOSE 130* 111*  BUN 13 11  CREATININE 0.73 0.77  CALCIUM 9.1 8.2*   GFR: Estimated Creatinine Clearance: 78.4 mL/min (by C-G formula based on SCr of 0.77 mg/dL). Liver Function Tests: Recent Labs  Lab 02/04/19 1735 02/05/19 0030  AST 82* 45*  ALT 81* 66*  ALKPHOS 207* 177*  BILITOT 1.1 0.9  PROT 7.4 6.6  ALBUMIN 3.7 3.2*   Recent Labs  Lab 02/04/19 1735  LIPASE 35   No results for input(s): AMMONIA in the last 168 hours. Coagulation Profile: Recent Labs  Lab 02/05/19 0030  INR 1.0   Cardiac Enzymes: No results for input(s): CKTOTAL, CKMB, CKMBINDEX, TROPONINI in the last 168 hours. BNP (last 3 results) No results for input(s): PROBNP in the last 8760 hours. HbA1C: No results for input(s): HGBA1C in the last 72 hours. CBG: Recent Labs  Lab 02/04/19 2357 02/05/19 0506 02/05/19 0743  GLUCAP 107* 87 101*   Lipid Profile: No results for input(s): CHOL, HDL, LDLCALC, TRIG, CHOLHDL, LDLDIRECT in the last 72 hours. Thyroid Function Tests: No results for input(s): TSH, T4TOTAL, FREET4, T3FREE, THYROIDAB in the last 72 hours. Anemia Panel: No results for input(s): VITAMINB12, FOLATE, FERRITIN, TIBC, IRON, RETICCTPCT in the last 72 hours. Urine analysis:    Component Value Date/Time   COLORURINE YELLOW 02/04/2019 [place] 02/04/2019 1735   LABSPEC 1.015 02/04/2019 1735   PHURINE 6.0 02/04/2019 1735   [place] 02/04/2019 1735   HGBUR NEGATIVE 02/04/2019 1735   [place] 02/04/2019 1735   [place] 02/04/2019 1735   PROTEINUR 30 (A) 02/04/2019 1735   NITRITE NEGATIVE 02/04/2019 1735   LEUKOCYTESUR NEGATIVE 02/04/2019 1735   Sepsis Labs: @LABRCNTIP (procalcitonin:4,lacticacidven:4)  ) Recent Results (from the past 240 hour(s))  SARS CORONAVIRUS 2 (TAT 6-12 HRS) Nasal Swab Aptima Multi Swab     Status: None   Collection Time:  02/04/19  9:21 PM   Specimen: Aptima Multi Swab; Nasal Swab  Result Value Ref Range Status   SARS Coronavirus 2 NEGATIVE NEGATIVE Final    Comment: (NOTE) SARS-CoV-2 target nucleic acids are NOT DETECTED. The SARS-CoV-2 RNA is generally detectable in upper and lower respiratory specimens during the acute phase of infection. Negative results do not preclude SARS-CoV-2 infection, do not rule out co-infections with other pathogens, and should not be used as the sole basis for treatment or other patient management decisions. Negative results must be combined with clinical observations, patient history, and epidemiological information. The expected result is Negative. Fact Sheet for Patients: SugarRoll.be Fact Sheet for Healthcare Providers: https://www.woods-mathews.com/ This test is not yet approved or cleared by the [place] FDA and  has been authorized for detection and/or diagnosis of SARS-CoV-2 by FDA under an Emergency Use Authorization (EUA). This EUA will remain  in effect (meaning this test can be used) for the duration of the COVID-19 declaration under Section 56 4(b)(1) of the Act, 21 U.S.C. section 360bbb-3(b)(1), unless  the authorization is terminated or revoked sooner. Performed at [facility], [place] [address], [place], [place] [address]          Radiology Studies: Ct Abdomen Pelvis W Contrast  Result Date: 02/04/2019 CLINICAL DATA:  LEFT total knee arthroplasty 01/14/2019. Acute onset of generalized weakness, generalized abdominal pain, fever and myalgias. Mild hypotension upon EMS arrival. EXAM: CT ABDOMEN AND PELVIS WITH CONTRAST TECHNIQUE: Multidetector CT imaging of the abdomen and pelvis was performed using the standard protocol following bolus administration of intravenous contrast. CONTRAST:  183mL OMNIPAQUE IOHEXOL 300 MG/ML IV. COMPARISON:  None. FINDINGS: Lower chest: Minimal scarring in the lingula. Very  small (approximate 3 mm) nodule in the LATERAL segment RIGHT MIDDLE LOBE (series 4, image 10). Visualized lung bases otherwise clear. Heart size normal. Mild RIGHT coronary artery atherosclerosis. Hepatobiliary: Mild intra and extrahepatic biliary ductal dilation, the common duct measuring up to 13 mm diameter. Very small (approximate 2 mm) stone in the distal common bile duct, though the duct is dilated beyond this stone, so this stone is not causing obstruction. Multiple small calcified gallstones in the gallbladder, the largest on the order of 6 mm. No evidence of pericholecystic edema or inflammation. No focal hepatic parenchymal abnormalities. Pancreas: Markedly atrophic pancreatic body and tail. Calcifications involving the pancreatic head. No pancreatic mass or peripancreatic inflammation. Spleen: Normal in size and appearance. Adrenals/Urinary Tract: Approximate 1.3 x 1.1 x 0.9 cm low-attenuation nodule involving the LEFT adrenal gland. Normal appearing RIGHT adrenal gland. Benign simple cyst arising from the ANTERIOR LOWER pole of the RIGHT kidney measuring approximately 6.4 x 5.2 cm. No solid masses involving either kidney. No hydronephrosis. No opaque urinary tract calculi. Normal appearing decompressed urinary bladder. Stomach/Bowel: Moderate-sized hiatal hernia. Stomach decompressed and otherwise normal in appearance. Normal-appearing small bowel. Diffuse colonic diverticulosis, most extensive in the sigmoid region, without evidence of acute diverticulitis. Liquid stool throughout the colon. Mobile cecum positioned in the RIGHT UPPER QUADRANT. Appendix not conspicuous, but no pericecal inflammation. Vascular/Lymphatic: Moderate aorto-iliofemoral atherosclerosis without evidence of aneurysm. Normal-appearing portal venous and systemic venous systems. No pathologic lymphadenopathy. Reproductive: Moderate to marked prostate gland enlargement. Asymmetric enlargement of the LEFT seminal vesicles. Other:  Small BILATERAL inguinal hernias containing fat, LEFT larger than RIGHT. Musculoskeletal: Multilevel degenerative disc disease and spondylosis throughout the lumbar spine. Facet degenerative changes throughout the lumbar spine. Desiccated disc extrusion at L5-S1 with INFERIOR migration of the desiccated disc fragment. LOWER thoracic spondylosis. No acute findings. IMPRESSION: 1. Choledocholithiasis, with a very small (approximate 2 mm) stone in the distal common bile duct; as the duct is dilated distal to this stone, it is not causing obstruction. 2. Cholelithiasis without CT evidence of acute cholecystitis. 3. Moderate-sized hiatal hernia. 4. Diffuse colonic diverticulosis without evidence of acute diverticulitis. Liquid stool throughout the colon. 5. Moderate to marked prostate gland enlargement. Asymmetric enlargement of the LEFT seminal vesicles. Correlate with PSA in order to exclude a prostate malignancy with LEFT seminal vesicle involvement. 6. Small BILATERAL inguinal hernias containing fat, LEFT larger than RIGHT. 7. Likely benign LEFT adrenal adenoma. 8. Very small 3 mm nodule in the LATERAL segment of the visualized RIGHT MIDDLE LOBE. Consider non-emergent CT chest to exclude other nodules elsewhere. Aortic Atherosclerosis (ICD10-I70.0). Electronically Signed   By: Evangeline Dakin M.D.   On: 02/04/2019 19:59        Scheduled Meds:  carvedilol  12.5 mg Oral BID WC   insulin aspart  0-9 Units Subcutaneous Q4H   pantoprazole  40  mg Oral Daily   pravastatin  40 mg Oral q1800   Continuous Infusions:  ampicillin-sulbactam (UNASYN) IV 1.5 g (02/05/19 1121)     LOS: 0 days    Time spent: 25 minutes    Edwin Dada, MD [facility] 02/05/2019, 11:25 AM     Please page through [place]:  [email or web address] Password [email or web address] If 7PM-7AM, please contact night-coverage

## 2019-02-05 NOTE — Anesthesia Procedure Notes (Signed)
Procedure Name: Intubation Date/Time: 02/05/2019 2:21 PM Performed by: Eben Burow, CRNA Pre-anesthesia Checklist: Patient identified, Emergency Drugs available, Suction available and Patient being monitored Patient Re-evaluated:Patient Re-evaluated prior to induction Oxygen Delivery Method: Circle system utilized Preoxygenation: Pre-oxygenation with 100% oxygen Induction Type: IV induction Ventilation: Mask ventilation without difficulty Laryngoscope Size: Mac and 4 Grade View: Grade I Tube type: Oral Tube size: 7.5 mm Number of attempts: 1 Airway Equipment and Method: Stylet and Oral airway Placement Confirmation: ETT inserted through vocal cords under direct vision,  positive ETCO2 and breath sounds checked- equal and bilateral Secured at: 23 cm Tube secured with: Tape Dental Injury: Teeth and Oropharynx as per pre-operative assessment

## 2019-02-05 NOTE — Anesthesia Postprocedure Evaluation (Signed)
Anesthesia Post Note  Patient: NATANAEL BERRETTA  Procedure(s) Performed: ENDOSCOPIC RETROGRADE CHOLANGIOPANCREATOGRAPHY (ERCP) (N/A ) SPHINCTEROTOMY     Patient location during evaluation: PACU Anesthesia Type: General Level of consciousness: awake and alert Pain management: pain level controlled Vital Signs Assessment: post-procedure vital signs reviewed and stable Respiratory status: spontaneous breathing, nonlabored ventilation, respiratory function stable and patient connected to nasal cannula oxygen Cardiovascular status: blood pressure returned to baseline and stable Postop Assessment: no apparent nausea or vomiting Anesthetic complications: no    Last Vitals:  Vitals:   02/05/19 1514 02/05/19 1520  BP: (!) 160/62 137/63  Pulse: 75 71  Resp: 17 14  Temp: 36.8 C   SpO2: 100% 100%    Last Pain:  Vitals:   02/05/19 1514  TempSrc: Temporal  PainSc: 1                  Lidia Collum

## 2019-02-05 NOTE — Consult Note (Signed)
[facility] Surgery ConsultNote  Timothy Farrell [birth year]  [id number].    Requesting MD: Dr. Myrene Buddy Chief Complaint/Reason for Consult: Choledocholithiasis  HPI: Timothy Farrell is a 83 y.o. male with a history of HTN, HLD, DM, GERD, and recent left TKA on 01/14/2019 that presented to the hospital on 8/24 for abdominal pain and nausea.  Patient reports that he had had approximately 10 days of persistent nausea.  He reportedly went to [place] where he had a new fever and was diagnosed with cellulitis of the left lower extremity was placed on Keflex.  On 8/24 he reports he is began having epigastric abdominal pain, with worsening nausea.  His pain was worse with palpation and movement.  He denies relieving factors.  He presented to the ED at [facility] where he was found to have choledocholithiasis and cholelithiasis without evidence of acute cholecystitis on CT. He underwent a ERCP with biliary sphincterotomy on 8/24 by Dr. Benson Norway.  We are asked to consult for possible cholecystectomy.  ROS: Review of Systems  Constitutional: Positive for fever. Negative for chills.  Respiratory: Negative for cough and shortness of breath.   Cardiovascular: Negative for chest pain and leg swelling.  Gastrointestinal: Positive for abdominal pain and nausea. Negative for blood in stool, constipation, diarrhea and vomiting.  Genitourinary: Negative for dysuria.  All other systems reviewed and are negative. All systems reviewed and otherwise negative except for as above  Family History  Problem Relation Age of Onset  . Diabetes Mother   . Hypertension Father   . Heart disease Father     Past Medical History:  Diagnosis Date  . Arthritis   . Diabetes mellitus without complication ([place])   . GERD (gastroesophageal reflux disease)   . Hypertension     Past Surgical History:  Procedure Laterality Date  . COLONOSCOPY    . FRACTURE SURGERY Left 2005   plate and screws  . TONSILLECTOMY     . TOTAL KNEE ARTHROPLASTY Left 01/14/2019   Procedure: TOTAL KNEE ARTHROPLASTY;  Surgeon: Vickey Huger, MD;  Location: [facility];  Service: Orthopedics;  Laterality: Left;  Marland Kitchen VASCULAR SURGERY  1960   rt leg    Social History:  reports that he has never smoked. He has never used smokeless tobacco. He reports that he does not drink alcohol or use drugs.  Allergies: No Known Allergies  Medications Prior to Admission  Medication Sig Dispense Refill  . acetaminophen (TYLENOL) 500 MG tablet Take 500-1,000 mg by mouth every 6 (six) hours as needed for moderate pain or headache.    Marland Kitchen amLODipine (NORVASC) 5 MG tablet Take 5 mg by mouth daily.    Marland Kitchen aspirin EC 81 MG tablet Take 81 mg by mouth daily.    . B COMPLEX-ZINC PO Take 1 tablet by mouth daily at 12 noon.    . carvedilol (COREG) 12.5 MG tablet Take 12.5 mg by mouth 2 (two) times daily with a meal.    . COD LIVER OIL PO Take 1 capsule by mouth daily at 12 noon.    . glyBURIDE (DIABETA) 2.5 MG tablet Take 2.5 mg by mouth daily at 12 noon.    Marland Kitchen lisinopril (ZESTRIL) 20 MG tablet Take 10 mg by mouth at bedtime.    . lovastatin (MEVACOR) 40 MG tablet Take 40 mg by mouth at bedtime.    . metFORMIN (GLUCOPHAGE) 1000 MG tablet Take 1,000 mg by mouth 2 (two) times a day.    . methocarbamol (ROBAXIN) 500  MG tablet Take 1-2 tablets (500-1,000 mg total) by mouth every 6 (six) hours as needed for muscle spasms. 60 tablet 0  . Multiple Vitamins-Minerals (CENTRUM SILVER 50+MEN PO) Take 1 tablet by mouth daily.    Marland Kitchen omeprazole (PRILOSEC OTC) 20 MG tablet Take 20 mg by mouth daily.    Marland Kitchen oxyCODONE (OXY IR/ROXICODONE) 5 MG immediate release tablet Take 1-2 tablets (5-10 mg total) by mouth every 6 (six) hours as needed for moderate pain (pain score 4-6). 50 tablet 0  . Probiotic CAPS Take 1 capsule by mouth daily.    . vitamin C (ASCORBIC ACID) 500 MG tablet Take 500 mg by mouth daily at 12 noon.    Marland Kitchen aspirin EC 325 MG EC tablet Take 1 tablet (325 mg total) by mouth  2 (two) times daily. (Patient not taking: Reported on 02/04/2019) 30 tablet 0    Prior to Admission medications   Medication Sig Start Date End Date Taking? Authorizing Provider  acetaminophen (TYLENOL) 500 MG tablet Take 500-1,000 mg by mouth every 6 (six) hours as needed for moderate pain or headache.   Yes [provider]  amLODipine (NORVASC) 5 MG tablet Take 5 mg by mouth daily.   Yes [provider]  aspirin EC 81 MG tablet Take 81 mg by mouth daily.   Yes [provider]  B COMPLEX-ZINC PO Take 1 tablet by mouth daily at 12 noon.   Yes [provider]  carvedilol (COREG) 12.5 MG tablet Take 12.5 mg by mouth 2 (two) times daily with a meal.   Yes [provider]  COD LIVER OIL PO Take 1 capsule by mouth daily at 12 noon.   Yes [provider]  glyBURIDE (DIABETA) 2.5 MG tablet Take 2.5 mg by mouth daily at 12 noon.   Yes [provider]  lisinopril (ZESTRIL) 20 MG tablet Take 10 mg by mouth at bedtime.   Yes [provider]  lovastatin (MEVACOR) 40 MG tablet Take 40 mg by mouth at bedtime.   Yes [provider]  metFORMIN (GLUCOPHAGE) 1000 MG tablet Take 1,000 mg by mouth 2 (two) times a day.   Yes [provider]  methocarbamol (ROBAXIN) 500 MG tablet Take 1-2 tablets (500-1,000 mg total) by mouth every 6 (six) hours as needed for muscle spasms. 01/15/19  Yes Donia Ast, PA  Multiple Vitamins-Minerals (CENTRUM SILVER 50+MEN PO) Take 1 tablet by mouth daily.   Yes [provider]  omeprazole (PRILOSEC OTC) 20 MG tablet Take 20 mg by mouth daily.   Yes [provider]  oxyCODONE (OXY IR/ROXICODONE) 5 MG immediate release tablet Take 1-2 tablets (5-10 mg total) by mouth every 6 (six) hours as needed for moderate pain (pain score 4-6). 01/15/19  Yes Donia Ast, PA  Probiotic CAPS Take 1 capsule by mouth daily.   Yes [provider]  vitamin C (ASCORBIC ACID) 500 MG  tablet Take 500 mg by mouth daily at 12 noon.   Yes [provider]  aspirin EC 325 MG EC tablet Take 1 tablet (325 mg total) by mouth 2 (two) times daily. Patient not taking: Reported on 02/04/2019 01/15/19   Donia Ast, PA    Blood pressure (!) 142/69, pulse 73, temperature (!) 97.5 F (36.4 C), temperature source Oral, resp. rate 16, height 5\' 11"  (1.803 m), weight 84.2 kg, SpO2 99 %. Physical Exam: General: pleasant, WD/WN white male who is laying in bed in NAD HEENT: head is normocephalic,  atraumatic.  Sclera are noninjected without scleral icterus.  Pupils equal and round.  Ears and nose without any masses or lesions.  Mouth is pink and moist. Dentition fair Heart: regular, rate, and rhythm.  No obvious murmurs, gallops, or rubs noted.  Palpable pedal pulses bilaterally Lungs: CTA b/l, no wheezes, rhonchi, or rales noted.  Respiratory effort nonlabored Abd: Soft, ND, little to no tenderness of the RUQ, +BS, no masses, hernias, or organomegaly MS: all 4 extremities are symmetrical with no cyanosis, clubbing, or edema. Skin: warm and dry with no masses, lesions, or rashes Psych: A&Ox3 with an appropriate affect. Neuro: cranial nerves grossly intact, extremity CSM intact bilaterally, normal speech  Results for orders placed or performed during the hospital encounter of 02/04/19 (from the past 48 hour(s))  Comprehensive metabolic panel     Status: Abnormal   Collection Time: 02/04/19  5:35 PM  Result Value Ref Range   Sodium 129 (L) 135 - 145 mmol/L   Potassium 4.5 3.5 - 5.1 mmol/L   Chloride 94 (L) 98 - 111 mmol/L   CO2 24 22 - 32 mmol/L   Glucose, Bld 130 (H) 70 - 99 mg/dL   BUN 13 8 - 23 mg/dL   Creatinine, Ser 0.73 0.61 - 1.24 mg/dL   Calcium 9.1 8.9 - 10.3 mg/dL   Total Protein 7.4 6.5 - 8.1 g/dL   Albumin 3.7 3.5 - 5.0 g/dL   AST 82 (H) 15 - 41 U/L   ALT 81 (H) 0 - 44 U/L   Alkaline Phosphatase 207 (H) 38 - 126 U/L   Total Bilirubin 1.1 0.3 - 1.2 mg/dL   GFR  calc non Af Amer >60 >60 mL/min   GFR calc Af Amer >60 >60 mL/min   Anion gap 11 5 - 15    Comment: Performed at [facility], [place] [address] [place], [place] [address]  Lipase, blood     Status: None   Collection Time: 02/04/19  5:35 PM  Result Value Ref Range   Lipase 35 11 - 51 U/L    Comment: Performed at [facility], [place] [address] [place], [place] [address]  CBC WITH DIFFERENTIAL     Status: Abnormal   Collection Time: 02/04/19  5:35 PM  Result Value Ref Range   WBC 15.6 (H) 4.0 - 10.5 K/uL   RBC 3.44 (L) 4.22 - 5.81 MIL/uL   Hemoglobin 10.9 (L) 13.0 - 17.0 g/dL   HCT 32.9 (L) 39.0 - 52.0 %   MCV 95.6 80.0 - 100.0 fL   MCH 31.7 26.0 - 34.0 pg   MCHC 33.1 30.0 - 36.0 g/dL   RDW 13.0 11.5 - 15.5 %   Platelets 486 (H) 150 - 400 K/uL   nRBC 0.0 0.0 - 0.2 %   Neutrophils Relative % 87 %   Neutro Abs 13.5 (H) 1.7 - 7.7 K/uL   Lymphocytes Relative 4 %   Lymphs Abs 0.7 0.7 - 4.0 K/uL   Monocytes Relative 8 %   Monocytes Absolute 1.2 (H) 0.1 - 1.0 K/uL   Eosinophils Relative 0 %   Eosinophils Absolute 0.0 0.0 - 0.5 K/uL   Basophils Relative 0 %   Basophils Absolute 0.0 0.0 - 0.1 K/uL   Immature Granulocytes 1 %   Abs Immature Granulocytes 0.11 (H) 0.00 - 0.07 K/uL    Comment: Performed at [facility], [place] [address] [place], [place] [address]  Urinalysis, Routine w reflex microscopic  Status: Abnormal   Collection Time: 02/04/19  5:35 PM  Result Value Ref Range   Color, Urine YELLOW YELLOW   APPearance CLEAR CLEAR   Specific Gravity, Urine 1.015 1.005 - 1.030   pH 6.0 5.0 - 8.0   Glucose, UA NEGATIVE NEGATIVE mg/dL   Hgb urine dipstick NEGATIVE NEGATIVE   Bilirubin Urine NEGATIVE NEGATIVE   Ketones, ur NEGATIVE NEGATIVE mg/dL   Protein, ur 30 (A) NEGATIVE mg/dL   Nitrite NEGATIVE NEGATIVE   Leukocytes,Ua NEGATIVE NEGATIVE   RBC / HPF 0-5 0 - 5 RBC/hpf   WBC, UA 0-5 0 - 5 WBC/hpf   Bacteria, UA NONE SEEN  NONE SEEN   Mucus PRESENT    Hyaline Casts, UA PRESENT     Comment: Performed at [facility], [place] [address] [place], [place] [address]  SARS CORONAVIRUS 2 (TAT 6-12 HRS) Nasal Swab Aptima Multi Swab     Status: None   Collection Time: 02/04/19  9:21 PM   Specimen: Aptima Multi Swab; Nasal Swab  Result Value Ref Range   SARS Coronavirus 2 NEGATIVE NEGATIVE    Comment: (NOTE) SARS-CoV-2 target nucleic acids are NOT DETECTED. The SARS-CoV-2 RNA is generally detectable in upper and lower respiratory specimens during the acute phase of infection. Negative results do not preclude SARS-CoV-2 infection, do not rule out co-infections with other pathogens, and should not be used as the sole basis for treatment or other patient management decisions. Negative results must be combined with clinical observations, patient history, and epidemiological information. The expected result is Negative. Fact Sheet for Patients: SugarRoll.be Fact Sheet for Healthcare Providers: https://www.woods-mathews.com/ This test is not yet approved or cleared by the [place] FDA and  has been authorized for detection and/or diagnosis of SARS-CoV-2 by FDA under an Emergency Use Authorization (EUA). This EUA will remain  in effect (meaning this test can be used) for the duration of the COVID-19 declaration under Section 56 4(b)(1) of the Act, 21 U.S.C. section 360bbb-3(b)(1), unless the authorization is terminated or revoked sooner. Performed at [facility], [place] [address] [place], [place] [address]   Glucose, capillary     Status: Abnormal   Collection Time: 02/04/19 11:57 PM  Result Value Ref Range   Glucose-Capillary 107 (H) 70 - 99 mg/dL  Comprehensive metabolic panel     Status: Abnormal   Collection Time: 02/05/19 12:30 AM  Result Value Ref Range   Sodium 126 (L) 135 - 145 mmol/L   Potassium 4.1 3.5 - 5.1 mmol/L   Chloride 92 (L)  98 - 111 mmol/L   CO2 21 (L) 22 - 32 mmol/L   Glucose, Bld 111 (H) 70 - 99 mg/dL   BUN 11 8 - 23 mg/dL   Creatinine, Ser 0.77 0.61 - 1.24 mg/dL   Calcium 8.2 (L) 8.9 - 10.3 mg/dL   Total Protein 6.6 6.5 - 8.1 g/dL   Albumin 3.2 (L) 3.5 - 5.0 g/dL   AST 45 (H) 15 - 41 U/L   ALT 66 (H) 0 - 44 U/L   Alkaline Phosphatase 177 (H) 38 - 126 U/L   Total Bilirubin 0.9 0.3 - 1.2 mg/dL   GFR calc non Af Amer >60 >60 mL/min   GFR calc Af Amer >60 >60 mL/min   Anion gap 13 5 - 15    Comment: Performed at [facility], [place] [address], [place], [place] [address]  CBC     Status: Abnormal   Collection Time:  02/05/19 12:30 AM  Result Value Ref Range   WBC 11.1 (H) 4.0 - 10.5 K/uL   RBC 3.03 (L) 4.22 - 5.81 MIL/uL   Hemoglobin 9.7 (L) 13.0 - 17.0 g/dL   HCT 29.1 (L) 39.0 - 52.0 %   MCV 96.0 80.0 - 100.0 fL   MCH 32.0 26.0 - 34.0 pg   MCHC 33.3 30.0 - 36.0 g/dL   RDW 13.2 11.5 - 15.5 %   Platelets 482 (H) 150 - 400 K/uL   nRBC 0.0 0.0 - 0.2 %    Comment: Performed at [facility], [place] [address] [place], [place] [address]  Protime-INR     Status: None   Collection Time: 02/05/19 12:30 AM  Result Value Ref Range   Prothrombin Time 13.5 11.4 - 15.2 seconds   INR 1.0 0.8 - 1.2    Comment: (NOTE) INR goal varies based on device and disease states. Performed at [facility], [place] [address], [place], [place] [address]   APTT     Status: None   Collection Time: 02/05/19 12:30 AM  Result Value Ref Range   aPTT 31 24 - 36 seconds    Comment: Performed at [facility] [place] [address], [place], [place] [address]  Glucose, capillary     Status: None   Collection Time: 02/05/19  5:06 AM  Result Value Ref Range   Glucose-Capillary 87 70 - 99 mg/dL  Glucose, capillary     Status: Abnormal   Collection Time: 02/05/19  7:43 AM  Result Value Ref Range   Glucose-Capillary 101 (H) 70 - 99 mg/dL  Glucose, capillary     Status:  Abnormal   Collection Time: 02/05/19  1:03 PM  Result Value Ref Range   Glucose-Capillary 109 (H) 70 - 99 mg/dL   Ct Abdomen Pelvis W Contrast  Result Date: 02/04/2019 CLINICAL DATA:  LEFT total knee arthroplasty 01/14/2019. Acute onset of generalized weakness, generalized abdominal pain, fever and myalgias. Mild hypotension upon EMS arrival. EXAM: CT ABDOMEN AND PELVIS WITH CONTRAST TECHNIQUE: Multidetector CT imaging of the abdomen and pelvis was performed using the standard protocol following bolus administration of intravenous contrast. CONTRAST:  131mL OMNIPAQUE IOHEXOL 300 MG/ML IV. COMPARISON:  None. FINDINGS: Lower chest: Minimal scarring in the lingula. Very small (approximate 3 mm) nodule in the LATERAL segment RIGHT MIDDLE LOBE (series 4, image 10). Visualized lung bases otherwise clear. Heart size normal. Mild RIGHT coronary artery atherosclerosis. Hepatobiliary: Mild intra and extrahepatic biliary ductal dilation, the common duct measuring up to 13 mm diameter. Very small (approximate 2 mm) stone in the distal common bile duct, though the duct is dilated beyond this stone, so this stone is not causing obstruction. Multiple small calcified gallstones in the gallbladder, the largest on the order of 6 mm. No evidence of pericholecystic edema or inflammation. No focal hepatic parenchymal abnormalities. Pancreas: Markedly atrophic pancreatic body and tail. Calcifications involving the pancreatic head. No pancreatic mass or peripancreatic inflammation. Spleen: Normal in size and appearance. Adrenals/Urinary Tract: Approximate 1.3 x 1.1 x 0.9 cm low-attenuation nodule involving the LEFT adrenal gland. Normal appearing RIGHT adrenal gland. Benign simple cyst arising from the ANTERIOR LOWER pole of the RIGHT kidney measuring approximately 6.4 x 5.2 cm. No solid masses involving either kidney. No hydronephrosis. No opaque urinary tract calculi. Normal appearing decompressed urinary bladder. Stomach/Bowel:  Moderate-sized hiatal hernia. Stomach decompressed and otherwise normal in appearance. Normal-appearing small bowel. Diffuse colonic diverticulosis, most extensive in the sigmoid  region, without evidence of acute diverticulitis. Liquid stool throughout the colon. Mobile cecum positioned in the RIGHT UPPER QUADRANT. Appendix not conspicuous, but no pericecal inflammation. Vascular/Lymphatic: Moderate aorto-iliofemoral atherosclerosis without evidence of aneurysm. Normal-appearing portal venous and systemic venous systems. No pathologic lymphadenopathy. Reproductive: Moderate to marked prostate gland enlargement. Asymmetric enlargement of the LEFT seminal vesicles. Other: Small BILATERAL inguinal hernias containing fat, LEFT larger than RIGHT. Musculoskeletal: Multilevel degenerative disc disease and spondylosis throughout the lumbar spine. Facet degenerative changes throughout the lumbar spine. Desiccated disc extrusion at L5-S1 with INFERIOR migration of the desiccated disc fragment. LOWER thoracic spondylosis. No acute findings. IMPRESSION: 1. Choledocholithiasis, with a very small (approximate 2 mm) stone in the distal common bile duct; as the duct is dilated distal to this stone, it is not causing obstruction. 2. Cholelithiasis without CT evidence of acute cholecystitis. 3. Moderate-sized hiatal hernia. 4. Diffuse colonic diverticulosis without evidence of acute diverticulitis. Liquid stool throughout the colon. 5. Moderate to marked prostate gland enlargement. Asymmetric enlargement of the LEFT seminal vesicles. Correlate with PSA in order to exclude a prostate malignancy with LEFT seminal vesicle involvement. 6. Small BILATERAL inguinal hernias containing fat, LEFT larger than RIGHT. 7. Likely benign LEFT adrenal adenoma. 8. Very small 3 mm nodule in the LATERAL segment of the visualized RIGHT MIDDLE LOBE. Consider non-emergent CT chest to exclude other nodules elsewhere. Aortic Atherosclerosis (ICD10-I70.0).  Electronically Signed   By: Evangeline Dakin M.D.   On: 02/04/2019 19:59   Dg Ercp Biliary & Pancreatic Ducts  Result Date: 02/05/2019 CLINICAL DATA:  83 year old male with a history of choledocholithiasis EXAM: ERCP TECHNIQUE: Multiple spot images obtained with the fluoroscopic device and submitted for interpretation post-procedure. FLUOROSCOPY TIME:  Fluoroscopy Time:  1 minutes 22 seconds COMPARISON:  None. FINDINGS: Limited intraoperative fluoroscopic spot images during ERCP. Endoscope projects over the upper abdomen with partial opacification of the extrahepatic biliary ducts. Deployment of a retrieval balloon. IMPRESSION: Limited images during ERCP demonstrates treatment of choledocholithiasis with deployment of a retrieval balloon. Please refer to the dictated operative report for full details of intraoperative findings and procedure. Electronically Signed   By: Corrie Mckusick D.O.   On: 02/05/2019 15:16   Anti-infectives (From admission, onward)   Start     Dose/Rate Route Frequency Ordered Stop   02/05/19 1200  ampicillin-sulbactam (UNASYN) 1.5 g in sodium chloride 0.9 % 100 mL IVPB     1.5 g 200 mL/hr over 30 Minutes Intravenous Every 6 hours 02/05/19 0737     02/05/19 0600  piperacillin-tazobactam (ZOSYN) IVPB 3.375 g  Status:  Discontinued     3.375 g 12.5 mL/hr over 240 Minutes Intravenous Every 8 hours 02/05/19 0412 02/05/19 0737   02/04/19 2115  piperacillin-tazobactam (ZOSYN) IVPB 3.375 g     3.375 g 100 mL/hr over 30 Minutes Intravenous  Once 02/04/19 2101 02/04/19 2206      Assessment/Plan HTN HLD DM GERD  Choledocholithiasis Cholelithiasis  - Status post ERCP, 8/25 by Dr. Benson Norway with biliary sphincterotomy - I have explained the procedure, risks, and aftercare of cholecystectomy.  Risks include but are not limited to bleeding, infection, wound problems, anesthesia, diarrhea, bile leak, injury to common bile duct/liver/intestine.  He seems to understand and agrees to  proceed. Wife is also at bedside and understands. All questions answered. Will plan for OR tomorrow.   ID - Unasyn VTE - SCDs,  FEN - CLD, NPO after midnight   Jillyn Ledger, [facility] 02/05/2019, 4:21 PM Pager: [phone]

## 2019-02-06 ENCOUNTER — Inpatient Hospital Stay (HOSPITAL_COMMUNITY): Payer: PPO | Admitting: Anesthesiology

## 2019-02-06 DIAGNOSIS — R911 Solitary pulmonary nodule: Secondary | ICD-10-CM | POA: Diagnosis present

## 2019-02-06 DIAGNOSIS — K810 Acute cholecystitis: Secondary | ICD-10-CM | POA: Diagnosis not present

## 2019-02-06 DIAGNOSIS — K5730 Diverticulosis of large intestine without perforation or abscess without bleeding: Secondary | ICD-10-CM | POA: Diagnosis present

## 2019-02-06 DIAGNOSIS — E1159 Type 2 diabetes mellitus with other circulatory complications: Secondary | ICD-10-CM

## 2019-02-06 DIAGNOSIS — Z20828 Contact with and (suspected) exposure to other viral communicable diseases: Secondary | ICD-10-CM | POA: Diagnosis present

## 2019-02-06 DIAGNOSIS — E785 Hyperlipidemia, unspecified: Secondary | ICD-10-CM

## 2019-02-06 DIAGNOSIS — K8050 Calculus of bile duct without cholangitis or cholecystitis without obstruction: Secondary | ICD-10-CM | POA: Diagnosis not present

## 2019-02-06 DIAGNOSIS — I10 Essential (primary) hypertension: Secondary | ICD-10-CM

## 2019-02-06 DIAGNOSIS — I152 Hypertension secondary to endocrine disorders: Secondary | ICD-10-CM | POA: Diagnosis present

## 2019-02-06 DIAGNOSIS — E871 Hypo-osmolality and hyponatremia: Secondary | ICD-10-CM | POA: Diagnosis present

## 2019-02-06 DIAGNOSIS — M1990 Unspecified osteoarthritis, unspecified site: Secondary | ICD-10-CM | POA: Diagnosis present

## 2019-02-06 DIAGNOSIS — Z7982 Long term (current) use of aspirin: Secondary | ICD-10-CM | POA: Diagnosis not present

## 2019-02-06 DIAGNOSIS — L03116 Cellulitis of left lower limb: Secondary | ICD-10-CM | POA: Diagnosis present

## 2019-02-06 DIAGNOSIS — Z7984 Long term (current) use of oral hypoglycemic drugs: Secondary | ICD-10-CM | POA: Diagnosis not present

## 2019-02-06 DIAGNOSIS — Z96652 Presence of left artificial knee joint: Secondary | ICD-10-CM | POA: Diagnosis present

## 2019-02-06 DIAGNOSIS — D649 Anemia, unspecified: Secondary | ICD-10-CM | POA: Diagnosis present

## 2019-02-06 DIAGNOSIS — K219 Gastro-esophageal reflux disease without esophagitis: Secondary | ICD-10-CM | POA: Diagnosis present

## 2019-02-06 DIAGNOSIS — K8062 Calculus of gallbladder and bile duct with acute cholecystitis without obstruction: Secondary | ICD-10-CM | POA: Diagnosis present

## 2019-02-06 DIAGNOSIS — R1011 Right upper quadrant pain: Secondary | ICD-10-CM | POA: Diagnosis present

## 2019-02-06 DIAGNOSIS — E1169 Type 2 diabetes mellitus with other specified complication: Secondary | ICD-10-CM | POA: Diagnosis present

## 2019-02-06 DIAGNOSIS — Z833 Family history of diabetes mellitus: Secondary | ICD-10-CM | POA: Diagnosis not present

## 2019-02-06 DIAGNOSIS — Z8249 Family history of ischemic heart disease and other diseases of the circulatory system: Secondary | ICD-10-CM | POA: Diagnosis not present

## 2019-02-06 LAB — CBC
HCT: 31.8 % — ABNORMAL LOW (ref 39.0–52.0)
Hemoglobin: 10.5 g/dL — ABNORMAL LOW (ref 13.0–17.0)
MCH: 31.9 pg (ref 26.0–34.0)
MCHC: 33 g/dL (ref 30.0–36.0)
MCV: 96.7 fL (ref 80.0–100.0)
Platelets: 421 10*3/uL — ABNORMAL HIGH (ref 150–400)
RBC: 3.29 MIL/uL — ABNORMAL LOW (ref 4.22–5.81)
RDW: 13.2 % (ref 11.5–15.5)
WBC: 7.2 10*3/uL (ref 4.0–10.5)
nRBC: 0 % (ref 0.0–0.2)

## 2019-02-06 LAB — COMPREHENSIVE METABOLIC PANEL
ALT: 52 U/L — ABNORMAL HIGH (ref 0–44)
AST: 26 U/L (ref 15–41)
Albumin: 3.4 g/dL — ABNORMAL LOW (ref 3.5–5.0)
Alkaline Phosphatase: 159 U/L — ABNORMAL HIGH (ref 38–126)
Anion gap: 9 (ref 5–15)
BUN: 11 mg/dL (ref 8–23)
CO2: 24 mmol/L (ref 22–32)
Calcium: 8.9 mg/dL (ref 8.9–10.3)
Chloride: 98 mmol/L (ref 98–111)
Creatinine, Ser: 0.63 mg/dL (ref 0.61–1.24)
GFR calc Af Amer: >60 mL/min (ref >60)
GFR calc non Af Amer: >60 mL/min (ref >60)
Glucose, Bld: 149 mg/dL — ABNORMAL HIGH (ref 70–99)
Potassium: 4.8 mmol/L (ref 3.5–5.1)
Sodium: 131 mmol/L — ABNORMAL LOW (ref 135–145)
Total Bilirubin: 0.7 mg/dL (ref 0.3–1.2)
Total Protein: 7.2 g/dL (ref 6.5–8.1)

## 2019-02-06 LAB — GLUCOSE, CAPILLARY
Glucose-Capillary: 109 mg/dL — ABNORMAL HIGH (ref 70–99)
Glucose-Capillary: 125 mg/dL — ABNORMAL HIGH (ref 70–99)
Glucose-Capillary: 137 mg/dL — ABNORMAL HIGH (ref 70–99)
Glucose-Capillary: 138 mg/dL — ABNORMAL HIGH (ref 70–99)
Glucose-Capillary: 140 mg/dL — ABNORMAL HIGH (ref 70–99)
Glucose-Capillary: 149 mg/dL — ABNORMAL HIGH (ref 70–99)
Glucose-Capillary: 176 mg/dL — ABNORMAL HIGH (ref 70–99)
Glucose-Capillary: 97 mg/dL (ref 70–99)

## 2019-02-06 LAB — MRSA PCR SCREENING: MRSA by PCR: POSITIVE — AB

## 2019-02-06 MED ORDER — FENTANYL CITRATE (PF) 100 MCG/2ML IJ SOLN
INTRAMUSCULAR | Status: AC
  Filled 2019-02-06: qty 2

## 2019-02-06 MED ORDER — CHLORHEXIDINE GLUCONATE CLOTH 2 % EX PADS
6.0000 | MEDICATED_PAD | Freq: Every day | CUTANEOUS | Status: DC
  Administered 2019-02-06 – 2019-02-07 (×2): 6 via TOPICAL

## 2019-02-06 MED ORDER — MUPIROCIN 2 % EX OINT
1.0000 "application " | TOPICAL_OINTMENT | Freq: Two times a day (BID) | CUTANEOUS | Status: DC
  Administered 2019-02-06 – 2019-02-08 (×5): 1 via NASAL
  Filled 2019-02-06: qty 22

## 2019-02-06 MED ORDER — PROPOFOL 10 MG/ML IV BOLUS
INTRAVENOUS | Status: AC
  Filled 2019-02-06: qty 40

## 2019-02-06 MED ORDER — LIDOCAINE 2% (20 MG/ML) 5 ML SYRINGE
INTRAMUSCULAR | Status: AC
  Filled 2019-02-06: qty 5

## 2019-02-06 MED ORDER — SUCCINYLCHOLINE CHLORIDE 200 MG/10ML IV SOSY
PREFILLED_SYRINGE | INTRAVENOUS | Status: AC
  Filled 2019-02-06: qty 10

## 2019-02-06 NOTE — Anesthesia Preprocedure Evaluation (Deleted)
Anesthesia Evaluation  Patient identified by MRN, date of birth, ID band Patient awake    Reviewed: Allergy & Precautions, NPO status , Patient's Chart, lab work & pertinent test results, reviewed documented beta blocker date and time   History of Anesthesia Complications Negative for: history of anesthetic complications  Airway Mallampati: II  TM Distance: >3 FB Neck ROM: Full    Dental  (+) Dental Advisory Given, Teeth Intact   Pulmonary neg pulmonary ROS,    Pulmonary exam normal        Cardiovascular hypertension, Pt. on home beta blockers and Pt. on medications (-) anginaNormal cardiovascular exam     Neuro/Psych negative neurological ROS  negative psych ROS   GI/Hepatic Neg liver ROS, GERD  Medicated and Controlled, Cholelithiasis    Endo/Other  diabetes, Type 2, Oral Hypoglycemic Agents Hyponatremia   Renal/GU negative Renal ROS     Musculoskeletal  (+) Arthritis ,   Abdominal   Peds  Hematology  (+) anemia ,   Anesthesia Other Findings   Reproductive/Obstetrics                            Anesthesia Physical Anesthesia Plan  ASA: II  Anesthesia Plan: General   Post-op Pain Management:    Induction: Intravenous  PONV Risk Score and Plan: 4 or greater and Treatment may vary due to age or medical condition, Ondansetron and Dexamethasone  Airway Management Planned: Oral ETT  Additional Equipment: None  Intra-op Plan:   Post-operative Plan: Extubation in OR  Informed Consent: I have reviewed the patients History and Physical, chart, labs and discussed the procedure including the risks, benefits and alternatives for the proposed anesthesia with the patient or authorized representative who has indicated his/her understanding and acceptance.     Dental advisory given  Plan Discussed with: CRNA and Anesthesiologist  Anesthesia Plan Comments:        Anesthesia  Quick Evaluation

## 2019-02-06 NOTE — Progress Notes (Signed)
Surgery cancelled today and postponed until tomorrow per Dr. Marlou Starks.  Patient updated and verbalized understanding.  Report called to Jarrett Soho, Therapist, sports.  Patient transported to 1524 by staff with no issues noted

## 2019-02-06 NOTE — Progress Notes (Signed)
PROGRESS NOTE    Timothy Farrell  [id number] DOB: 03-Apr-1936 DOA: 02/04/2019 PCP: Myrlene Broker, MD    Brief Narrative:  83 y.o. M with hx HTN, DM and OA with recent left TKA who presented with few weeks nausea, anorexia and sweats.  6 days ago, presented to OSH with fever, sweats, diagnosed with cellulitis, discharged with cephalexin.  Since then, developed new abdominal aching pain and lightheadedness, and so came to ER.  In the ER, CT showed a choledocholithiasis and leukocytosis. Started on antibiotics.  GI were consulted who recommended ERCP.  Assessment & Plan:   Principal Problem:   Choledocholithiasis Active Problems:   Type 2 diabetes mellitus ([place])   Hypertension associated with diabetes ([place])   Hyperlipidemia associated with type 2 diabetes mellitus ([facility])   Lung nodule seen on imaging study   Acute cholecystitis  Choledocholithiasis Suspicion for cholangitis Patient has had fevers, sweats, elevated transaminases, right upper quadrant pain, and leukocytosis. -Bowel rest -Cont on IV fluids -IV morphine for pain, Zofran for nausea -Continue antibiotics, since narrowed to Unasyn -Consult to GI, appreciate expert cares -s/p ERCP 8/25, unremarkable with sphincterotomy performed -General Surgery following, initial plan for surgery today, now rescheduled for tomorrow  Hypertension Pressure well stable -Continue carvedilol - Holding amlodipine, lisinopril  Diabetes Blood glucose well controlled overnight - Continue sliding scale corrections -Continue statin -Holding metformin while in hospital  Hyponatremia Mild -Follow BMET  Normocytic anemia No clinical blood loss.  Baseline hemoglobin unknown.  Hemoglobin 12.1 and admission. Hgb stable  DVT prophylaxis: SCD's Code Status: Full Family Communication: Pt in room, family not at bedside Disposition Plan: Uncertain at this time  Consultants:   General Surgery  GI  Procedures:   ERCP  8/25  Antimicrobials: Anti-infectives (From admission, onward)   Start     Dose/Rate Route Frequency Ordered Stop   02/05/19 1200  ampicillin-sulbactam (UNASYN) 1.5 g in sodium chloride 0.9 % 100 mL IVPB     1.5 g 200 mL/hr over 30 Minutes Intravenous Every 6 hours 02/05/19 0737     02/05/19 0600  piperacillin-tazobactam (ZOSYN) IVPB 3.375 g  Status:  Discontinued     3.375 g 12.5 mL/hr over 240 Minutes Intravenous Every 8 hours 02/05/19 0412 02/05/19 0737   02/04/19 2115  piperacillin-tazobactam (ZOSYN) IVPB 3.375 g     3.375 g 100 mL/hr over 30 Minutes Intravenous  Once 02/04/19 2101 02/04/19 2206       Subjective: Eager to have surgery this AM  Objective: Vitals:   02/05/19 1830 02/05/19 2034 02/06/19 0520 02/06/19 1329  BP: 130/66 (!) 113/57 (!) 143/73 139/81  Pulse: 71 62 73 69  Resp: 18 14 16 18   Temp:  98.2 F (36.8 C) 98.2 F (36.8 C) 98.1 F (36.7 C)  TempSrc:  Oral Oral Oral  SpO2: 100% 97% 98% 100%  Weight:   83.6 kg   Height:        Intake/Output Summary (Last 24 hours) at 02/06/2019 1644 Last data filed at 02/06/2019 1327 Gross per 24 hour  Intake 801.4 ml  Output 2575 ml  Net -1773.6 ml   Filed Weights   02/04/19 1649 02/05/19 0502 02/06/19 0520  Weight: 81.6 kg 84.2 kg 83.6 kg    Examination:  General exam: Appears calm and comfortable  Respiratory system: Clear to auscultation. Respiratory effort normal. Cardiovascular system: S1 & S2 heard, RRR Gastrointestinal system: Abdomen is nondistended, soft and nontender. No organomegaly or masses felt. Normal bowel sounds heard. Central nervous system:  Alert and oriented. No focal neurological deficits. Extremities: Symmetric 5 x 5 power. Skin: No rashes, lesions  Psychiatry: Judgement and insight appear normal. Mood & affect appropriate.   Data Reviewed: I have personally reviewed following labs and imaging studies  CBC: Recent Labs  Lab 02/04/19 1735 02/05/19 0030 02/06/19 0327  WBC 15.6*  11.1* 7.2  NEUTROABS 13.5*  --   --   HGB 10.9* 9.7* 10.5*  HCT 32.9* 29.1* 31.8*  MCV 95.6 96.0 96.7  PLT 486* 482* [id number]*   Basic Metabolic Panel: Recent Labs  Lab 02/04/19 1735 02/05/19 0030 02/06/19 0327  NA 129* 126* 131*  K 4.5 4.1 4.8  CL 94* 92* 98  CO2 24 21* 24  GLUCOSE 130* 111* 149*  BUN 13 11 11   CREATININE 0.73 0.77 0.63  CALCIUM 9.1 8.2* 8.9   GFR: Estimated Creatinine Clearance: 78.4 mL/min (by C-G formula based on SCr of 0.63 mg/dL). Liver Function Tests: Recent Labs  Lab 02/04/19 1735 02/05/19 0030 02/06/19 0327  AST 82* 45* 26  ALT 81* 66* 52*  ALKPHOS 207* 177* 159*  BILITOT 1.1 0.9 0.7  PROT 7.4 6.6 7.2  ALBUMIN 3.7 3.2* 3.4*   Recent Labs  Lab 02/04/19 1735  LIPASE 35   No results for input(s): AMMONIA in the last 168 hours. Coagulation Profile: Recent Labs  Lab 02/05/19 0030  INR 1.0   Cardiac Enzymes: No results for input(s): CKTOTAL, CKMB, CKMBINDEX, TROPONINI in the last 168 hours. BNP (last 3 results) No results for input(s): PROBNP in the last 8760 hours. HbA1C: No results for input(s): HGBA1C in the last 72 hours. CBG: Recent Labs  Lab 02/06/19 0410 02/06/19 0738 02/06/19 1202 02/06/19 1404 02/06/19 1620  GLUCAP 140* 138* 137* 109* 125*   Lipid Profile: No results for input(s): CHOL, HDL, LDLCALC, TRIG, CHOLHDL, LDLDIRECT in the last 72 hours. Thyroid Function Tests: No results for input(s): TSH, T4TOTAL, FREET4, T3FREE, THYROIDAB in the last 72 hours. Anemia Panel: No results for input(s): VITAMINB12, FOLATE, FERRITIN, TIBC, IRON, RETICCTPCT in the last 72 hours. Sepsis Labs: No results for input(s): PROCALCITON, LATICACIDVEN in the last 168 hours.  Recent Results (from the past 240 hour(s))  SARS CORONAVIRUS 2 (TAT 6-12 HRS) Nasal Swab Aptima Multi Swab     Status: None   Collection Time: 02/04/19  9:21 PM   Specimen: Aptima Multi Swab; Nasal Swab  Result Value Ref Range Status   SARS Coronavirus 2 NEGATIVE  NEGATIVE Final    Comment: (NOTE) SARS-CoV-2 target nucleic acids are NOT DETECTED. The SARS-CoV-2 RNA is generally detectable in upper and lower respiratory specimens during the acute phase of infection. Negative results do not preclude SARS-CoV-2 infection, do not rule out co-infections with other pathogens, and should not be used as the sole basis for treatment or other patient management decisions. Negative results must be combined with clinical observations, patient history, and epidemiological information. The expected result is Negative. Fact Sheet for Patients: SugarRoll.be Fact Sheet for Healthcare Providers: https://www.woods-mathews.com/ This test is not yet approved or cleared by the [place] FDA and  has been authorized for detection and/or diagnosis of SARS-CoV-2 by FDA under an Emergency Use Authorization (EUA). This EUA will remain  in effect (meaning this test can be used) for the duration of the COVID-19 declaration under Section 56 4(b)(1) of the Act, 21 U.S.C. section 360bbb-3(b)(1), unless the authorization is terminated or revoked sooner. Performed at [facility], [place] [address] [place], [place] [address]  Culture, blood (routine x 2)     Status: None (Preliminary result)   Collection Time: 02/05/19 12:30 AM   Specimen: BLOOD  Result Value Ref Range Status   Specimen Description   Final    BLOOD RIGHT ANTECUBITAL Performed at [facility] [address] [place], [place] [address]    Special Requests   Final    BOTTLES DRAWN AEROBIC AND ANAEROBIC Blood Culture adequate volume Performed at [facility] [address] [place], [place] [address]    Culture   Final    NO GROWTH 1 DAY Performed at [facility], [place] [address], [place], [place] [address]    Report Status PENDING  Incomplete  Culture, blood (routine x 2)     Status: None (Preliminary result)    Collection Time: 02/05/19  1:02 AM   Specimen: BLOOD  Result Value Ref Range Status   Specimen Description   Final    BLOOD BLOOD LEFT HAND Performed at [facility] [address] [place], [place] [address]    Special Requests   Final    BOTTLES DRAWN AEROBIC AND ANAEROBIC Blood Culture adequate volume Performed at [facility] [address], [place], [place] [address]    Culture   Final    NO GROWTH 1 DAY Performed at [facility], [place] [address], [place], [place] [address]    Report Status PENDING  Incomplete  MRSA PCR Screening     Status: Abnormal   Collection Time: 02/06/19  4:08 AM   Specimen: Nasal Mucosa; Nasopharyngeal  Result Value Ref Range Status   MRSA by PCR POSITIVE (A) NEGATIVE Final    Comment:        The GeneXpert MRSA Assay (FDA approved for NASAL specimens only), is one component of a comprehensive MRSA colonization surveillance program. It is not intended to diagnose MRSA infection nor to guide or monitor treatment for MRSA infections. RESULT CALLED TO, READ BACK BY AND VERIFIED WITH: FRADY,H @ [id number] ON [id number] BY POTEAT,S Performed at [facility] [address] [place], [place] [address]      Radiology Studies: Ct Abdomen Pelvis W Contrast  Result Date: 02/04/2019 CLINICAL DATA:  LEFT total knee arthroplasty 01/14/2019. Acute onset of generalized weakness, generalized abdominal pain, fever and myalgias. Mild hypotension upon EMS arrival. EXAM: CT ABDOMEN AND PELVIS WITH CONTRAST TECHNIQUE: Multidetector CT imaging of the abdomen and pelvis was performed using the standard protocol following bolus administration of intravenous contrast. CONTRAST:  130mL OMNIPAQUE IOHEXOL 300 MG/ML IV. COMPARISON:  None. FINDINGS: Lower chest: Minimal scarring in the lingula. Very small (approximate 3 mm) nodule in the LATERAL segment RIGHT MIDDLE LOBE (series 4, image 10). Visualized lung bases  otherwise clear. Heart size normal. Mild RIGHT coronary artery atherosclerosis. Hepatobiliary: Mild intra and extrahepatic biliary ductal dilation, the common duct measuring up to 13 mm diameter. Very small (approximate 2 mm) stone in the distal common bile duct, though the duct is dilated beyond this stone, so this stone is not causing obstruction. Multiple small calcified gallstones in the gallbladder, the largest on the order of 6 mm. No evidence of pericholecystic edema or inflammation. No focal hepatic parenchymal abnormalities. Pancreas: Markedly atrophic pancreatic body and tail. Calcifications involving the pancreatic head. No pancreatic mass or peripancreatic inflammation. Spleen: Normal in size and appearance. Adrenals/Urinary Tract: Approximate 1.3 x 1.1 x 0.9 cm low-attenuation nodule involving the LEFT adrenal gland. Normal appearing RIGHT  adrenal gland. Benign simple cyst arising from the ANTERIOR LOWER pole of the RIGHT kidney measuring approximately 6.4 x 5.2 cm. No solid masses involving either kidney. No hydronephrosis. No opaque urinary tract calculi. Normal appearing decompressed urinary bladder. Stomach/Bowel: Moderate-sized hiatal hernia. Stomach decompressed and otherwise normal in appearance. Normal-appearing small bowel. Diffuse colonic diverticulosis, most extensive in the sigmoid region, without evidence of acute diverticulitis. Liquid stool throughout the colon. Mobile cecum positioned in the RIGHT UPPER QUADRANT. Appendix not conspicuous, but no pericecal inflammation. Vascular/Lymphatic: Moderate aorto-iliofemoral atherosclerosis without evidence of aneurysm. Normal-appearing portal venous and systemic venous systems. No pathologic lymphadenopathy. Reproductive: Moderate to marked prostate gland enlargement. Asymmetric enlargement of the LEFT seminal vesicles. Other: Small BILATERAL inguinal hernias containing fat, LEFT larger than RIGHT. Musculoskeletal: Multilevel degenerative disc  disease and spondylosis throughout the lumbar spine. Facet degenerative changes throughout the lumbar spine. Desiccated disc extrusion at L5-S1 with INFERIOR migration of the desiccated disc fragment. LOWER thoracic spondylosis. No acute findings. IMPRESSION: 1. Choledocholithiasis, with a very small (approximate 2 mm) stone in the distal common bile duct; as the duct is dilated distal to this stone, it is not causing obstruction. 2. Cholelithiasis without CT evidence of acute cholecystitis. 3. Moderate-sized hiatal hernia. 4. Diffuse colonic diverticulosis without evidence of acute diverticulitis. Liquid stool throughout the colon. 5. Moderate to marked prostate gland enlargement. Asymmetric enlargement of the LEFT seminal vesicles. Correlate with PSA in order to exclude a prostate malignancy with LEFT seminal vesicle involvement. 6. Small BILATERAL inguinal hernias containing fat, LEFT larger than RIGHT. 7. Likely benign LEFT adrenal adenoma. 8. Very small 3 mm nodule in the LATERAL segment of the visualized RIGHT MIDDLE LOBE. Consider non-emergent CT chest to exclude other nodules elsewhere. Aortic Atherosclerosis (ICD10-I70.0). Electronically Signed   By: Evangeline Dakin M.D.   On: 02/04/2019 19:59   Dg Ercp Biliary & Pancreatic Ducts  Result Date: 02/05/2019 CLINICAL DATA:  83 year old male with a history of choledocholithiasis EXAM: ERCP TECHNIQUE: Multiple spot images obtained with the fluoroscopic device and submitted for interpretation post-procedure. FLUOROSCOPY TIME:  Fluoroscopy Time:  1 minutes 22 seconds COMPARISON:  None. FINDINGS: Limited intraoperative fluoroscopic spot images during ERCP. Endoscope projects over the upper abdomen with partial opacification of the extrahepatic biliary ducts. Deployment of a retrieval balloon. IMPRESSION: Limited images during ERCP demonstrates treatment of choledocholithiasis with deployment of a retrieval balloon. Please refer to the dictated operative report  for full details of intraoperative findings and procedure. Electronically Signed   By: Corrie Mckusick D.O.   On: 02/05/2019 15:16    Scheduled Meds: . carvedilol  12.5 mg Oral BID WC  . Chlorhexidine Gluconate Cloth  6 each Topical Q0600  . insulin aspart  0-9 Units Subcutaneous Q4H  . mupirocin ointment  1 application Nasal BID  . pantoprazole  40 mg Oral Daily  . pravastatin  40 mg Oral q1800   Continuous Infusions: . ampicillin-sulbactam (UNASYN) IV 1.5 g (02/06/19 0501)     LOS: 0 days   Marylu Lund, MD [facility] Pager On Amion  If 7PM-7AM, please contact night-coverage 02/06/2019, 4:44 PM

## 2019-02-06 NOTE — Progress Notes (Signed)
1 Day Post-Op   Subjective/Chief Complaint: No complaints   Objective: Vital signs in last 24 hours: Temp:  [97.5 F (36.4 C)-98.3 F (36.8 C)] 98.2 F (36.8 C) (08/26 0520) Pulse Rate:  [62-75] 73 (08/26 0520) Resp:  [9-18] 16 (08/26 0520) BP: (113-160)/(57-107) 143/73 (08/26 0520) SpO2:  [97 %-100 %] 98 % (08/26 0520) Weight:  [83.6 kg] 83.6 kg (08/26 0520) Last BM Date: 02/05/19  Intake/Output from previous day: 08/25 0701 - 08/26 0700 In: 2000 [P.O.:340; I.V.:1216.1; IV Piggyback:443.9] Out: 2775 [Urine:2775] Intake/Output this shift: No intake/output data recorded.  General appearance: alert and cooperative Resp: clear to auscultation bilaterally Cardio: regular rate and rhythm GI: soft, nontender  Lab Results:  Recent Labs    02/05/19 0030 02/06/19 0327  WBC 11.1* 7.2  HGB 9.7* 10.5*  HCT 29.1* 31.8*  PLT 482* 421*   BMET Recent Labs    02/05/19 0030 02/06/19 0327  NA 126* 131*  K 4.1 4.8  CL 92* 98  CO2 21* 24  GLUCOSE 111* 149*  BUN 11 11  CREATININE 0.77 0.63  CALCIUM 8.2* 8.9   PT/INR Recent Labs    02/05/19 0030  LABPROT 13.5  INR 1.0   ABG No results for input(s): PHART, HCO3 in the last 72 hours.  Invalid input(s): PCO2, PO2  Studies/Results: Ct Abdomen Pelvis W Contrast  Result Date: 02/04/2019 CLINICAL DATA:  LEFT total knee arthroplasty 01/14/2019. Acute onset of generalized weakness, generalized abdominal pain, fever and myalgias. Mild hypotension upon EMS arrival. EXAM: CT ABDOMEN AND PELVIS WITH CONTRAST TECHNIQUE: Multidetector CT imaging of the abdomen and pelvis was performed using the standard protocol following bolus administration of intravenous contrast. CONTRAST:  121mL OMNIPAQUE IOHEXOL 300 MG/ML IV. COMPARISON:  None. FINDINGS: Lower chest: Minimal scarring in the lingula. Very small (approximate 3 mm) nodule in the LATERAL segment RIGHT MIDDLE LOBE (series 4, image 10). Visualized lung bases otherwise clear. Heart  size normal. Mild RIGHT coronary artery atherosclerosis. Hepatobiliary: Mild intra and extrahepatic biliary ductal dilation, the common duct measuring up to 13 mm diameter. Very small (approximate 2 mm) stone in the distal common bile duct, though the duct is dilated beyond this stone, so this stone is not causing obstruction. Multiple small calcified gallstones in the gallbladder, the largest on the order of 6 mm. No evidence of pericholecystic edema or inflammation. No focal hepatic parenchymal abnormalities. Pancreas: Markedly atrophic pancreatic body and tail. Calcifications involving the pancreatic head. No pancreatic mass or peripancreatic inflammation. Spleen: Normal in size and appearance. Adrenals/Urinary Tract: Approximate 1.3 x 1.1 x 0.9 cm low-attenuation nodule involving the LEFT adrenal gland. Normal appearing RIGHT adrenal gland. Benign simple cyst arising from the ANTERIOR LOWER pole of the RIGHT kidney measuring approximately 6.4 x 5.2 cm. No solid masses involving either kidney. No hydronephrosis. No opaque urinary tract calculi. Normal appearing decompressed urinary bladder. Stomach/Bowel: Moderate-sized hiatal hernia. Stomach decompressed and otherwise normal in appearance. Normal-appearing small bowel. Diffuse colonic diverticulosis, most extensive in the sigmoid region, without evidence of acute diverticulitis. Liquid stool throughout the colon. Mobile cecum positioned in the RIGHT UPPER QUADRANT. Appendix not conspicuous, but no pericecal inflammation. Vascular/Lymphatic: Moderate aorto-iliofemoral atherosclerosis without evidence of aneurysm. Normal-appearing portal venous and systemic venous systems. No pathologic lymphadenopathy. Reproductive: Moderate to marked prostate gland enlargement. Asymmetric enlargement of the LEFT seminal vesicles. Other: Small BILATERAL inguinal hernias containing fat, LEFT larger than RIGHT. Musculoskeletal: Multilevel degenerative disc disease and spondylosis  throughout the lumbar spine. Facet degenerative changes throughout the lumbar  spine. Desiccated disc extrusion at L5-S1 with INFERIOR migration of the desiccated disc fragment. LOWER thoracic spondylosis. No acute findings. IMPRESSION: 1. Choledocholithiasis, with a very small (approximate 2 mm) stone in the distal common bile duct; as the duct is dilated distal to this stone, it is not causing obstruction. 2. Cholelithiasis without CT evidence of acute cholecystitis. 3. Moderate-sized hiatal hernia. 4. Diffuse colonic diverticulosis without evidence of acute diverticulitis. Liquid stool throughout the colon. 5. Moderate to marked prostate gland enlargement. Asymmetric enlargement of the LEFT seminal vesicles. Correlate with PSA in order to exclude a prostate malignancy with LEFT seminal vesicle involvement. 6. Small BILATERAL inguinal hernias containing fat, LEFT larger than RIGHT. 7. Likely benign LEFT adrenal adenoma. 8. Very small 3 mm nodule in the LATERAL segment of the visualized RIGHT MIDDLE LOBE. Consider non-emergent CT chest to exclude other nodules elsewhere. Aortic Atherosclerosis (ICD10-I70.0). Electronically Signed   By: Evangeline Dakin M.D.   On: 02/04/2019 19:59   Dg Ercp Biliary & Pancreatic Ducts  Result Date: 02/05/2019 CLINICAL DATA:  83 year old male with a history of choledocholithiasis EXAM: ERCP TECHNIQUE: Multiple spot images obtained with the fluoroscopic device and submitted for interpretation post-procedure. FLUOROSCOPY TIME:  Fluoroscopy Time:  1 minutes 22 seconds COMPARISON:  None. FINDINGS: Limited intraoperative fluoroscopic spot images during ERCP. Endoscope projects over the upper abdomen with partial opacification of the extrahepatic biliary ducts. Deployment of a retrieval balloon. IMPRESSION: Limited images during ERCP demonstrates treatment of choledocholithiasis with deployment of a retrieval balloon. Please refer to the dictated operative report for full details of  intraoperative findings and procedure. Electronically Signed   By: Corrie Mckusick D.O.   On: 02/05/2019 15:16    Anti-infectives: Anti-infectives (From admission, onward)   Start     Dose/Rate Route Frequency Ordered Stop   02/05/19 1200  ampicillin-sulbactam (UNASYN) 1.5 g in sodium chloride 0.9 % 100 mL IVPB     1.5 g 200 mL/hr over 30 Minutes Intravenous Every 6 hours 02/05/19 0737     02/05/19 0600  piperacillin-tazobactam (ZOSYN) IVPB 3.375 g  Status:  Discontinued     3.375 g 12.5 mL/hr over 240 Minutes Intravenous Every 8 hours 02/05/19 0412 02/05/19 0737   02/04/19 2115  piperacillin-tazobactam (ZOSYN) IVPB 3.375 g     3.375 g 100 mL/hr over 30 Minutes Intravenous  Once 02/04/19 2101 02/04/19 2206      Assessment/Plan: s/p Procedure(s) with comments: ENDOSCOPIC RETROGRADE CHOLANGIOPANCREATOGRAPHY (ERCP) (N/A) SPHINCTEROTOMY - balloon sweep plan for lap chole today. Risks and benefits of the surgery as well as some of the technical aspects discussed with pt and he understands and wishes to proceed  LOS: 0 days    Autumn Messing III 02/06/2019

## 2019-02-06 NOTE — Progress Notes (Signed)
PT Cancellation Note  Patient Details Name: Timothy Farrell MRN: [id number] DOB: [birth year]   Cancelled Treatment:     PT order received. Pt is scheduled for OR today. Will hold eval until after the procedure and when medically stable.    Lelon Mast 02/06/2019, 11:02 AM

## 2019-02-06 NOTE — H&P (View-Only) (Signed)
1 Day Post-Op   Subjective/Chief Complaint: No complaints   Objective: Vital signs in last 24 hours: Temp:  [97.5 F (36.4 C)-98.3 F (36.8 C)] 98.2 F (36.8 C) (08/26 0520) Pulse Rate:  [62-75] 73 (08/26 0520) Resp:  [9-18] 16 (08/26 0520) BP: (113-160)/(57-107) 143/73 (08/26 0520) SpO2:  [97 %-100 %] 98 % (08/26 0520) Weight:  [83.6 kg] 83.6 kg (08/26 0520) Last BM Date: 02/05/19  Intake/Output from previous day: 08/25 0701 - 08/26 0700 In: 2000 [P.O.:340; I.V.:1216.1; IV Piggyback:443.9] Out: 2775 [Urine:2775] Intake/Output this shift: No intake/output data recorded.  General appearance: alert and cooperative Resp: clear to auscultation bilaterally Cardio: regular rate and rhythm GI: soft, nontender  Lab Results:  Recent Labs    02/05/19 0030 02/06/19 0327  WBC 11.1* 7.2  HGB 9.7* 10.5*  HCT 29.1* 31.8*  PLT 482* 421*   BMET Recent Labs    02/05/19 0030 02/06/19 0327  NA 126* 131*  K 4.1 4.8  CL 92* 98  CO2 21* 24  GLUCOSE 111* 149*  BUN 11 11  CREATININE 0.77 0.63  CALCIUM 8.2* 8.9   PT/INR Recent Labs    02/05/19 0030  LABPROT 13.5  INR 1.0   ABG No results for input(s): PHART, HCO3 in the last 72 hours.  Invalid input(s): PCO2, PO2  Studies/Results: Ct Abdomen Pelvis W Contrast  Result Date: 02/04/2019 CLINICAL DATA:  LEFT total knee arthroplasty 01/14/2019. Acute onset of generalized weakness, generalized abdominal pain, fever and myalgias. Mild hypotension upon EMS arrival. EXAM: CT ABDOMEN AND PELVIS WITH CONTRAST TECHNIQUE: Multidetector CT imaging of the abdomen and pelvis was performed using the standard protocol following bolus administration of intravenous contrast. CONTRAST:  16mL OMNIPAQUE IOHEXOL 300 MG/ML IV. COMPARISON:  None. FINDINGS: Lower chest: Minimal scarring in the lingula. Very small (approximate 3 mm) nodule in the LATERAL segment RIGHT MIDDLE LOBE (series 4, image 10). Visualized lung bases otherwise clear. Heart  size normal. Mild RIGHT coronary artery atherosclerosis. Hepatobiliary: Mild intra and extrahepatic biliary ductal dilation, the common duct measuring up to 13 mm diameter. Very small (approximate 2 mm) stone in the distal common bile duct, though the duct is dilated beyond this stone, so this stone is not causing obstruction. Multiple small calcified gallstones in the gallbladder, the largest on the order of 6 mm. No evidence of pericholecystic edema or inflammation. No focal hepatic parenchymal abnormalities. Pancreas: Markedly atrophic pancreatic body and tail. Calcifications involving the pancreatic head. No pancreatic mass or peripancreatic inflammation. Spleen: Normal in size and appearance. Adrenals/Urinary Tract: Approximate 1.3 x 1.1 x 0.9 cm low-attenuation nodule involving the LEFT adrenal gland. Normal appearing RIGHT adrenal gland. Benign simple cyst arising from the ANTERIOR LOWER pole of the RIGHT kidney measuring approximately 6.4 x 5.2 cm. No solid masses involving either kidney. No hydronephrosis. No opaque urinary tract calculi. Normal appearing decompressed urinary bladder. Stomach/Bowel: Moderate-sized hiatal hernia. Stomach decompressed and otherwise normal in appearance. Normal-appearing small bowel. Diffuse colonic diverticulosis, most extensive in the sigmoid region, without evidence of acute diverticulitis. Liquid stool throughout the colon. Mobile cecum positioned in the RIGHT UPPER QUADRANT. Appendix not conspicuous, but no pericecal inflammation. Vascular/Lymphatic: Moderate aorto-iliofemoral atherosclerosis without evidence of aneurysm. Normal-appearing portal venous and systemic venous systems. No pathologic lymphadenopathy. Reproductive: Moderate to marked prostate gland enlargement. Asymmetric enlargement of the LEFT seminal vesicles. Other: Small BILATERAL inguinal hernias containing fat, LEFT larger than RIGHT. Musculoskeletal: Multilevel degenerative disc disease and spondylosis  throughout the lumbar spine. Facet degenerative changes throughout the lumbar  spine. Desiccated disc extrusion at L5-S1 with INFERIOR migration of the desiccated disc fragment. LOWER thoracic spondylosis. No acute findings. IMPRESSION: 1. Choledocholithiasis, with a very small (approximate 2 mm) stone in the distal common bile duct; as the duct is dilated distal to this stone, it is not causing obstruction. 2. Cholelithiasis without CT evidence of acute cholecystitis. 3. Moderate-sized hiatal hernia. 4. Diffuse colonic diverticulosis without evidence of acute diverticulitis. Liquid stool throughout the colon. 5. Moderate to marked prostate gland enlargement. Asymmetric enlargement of the LEFT seminal vesicles. Correlate with PSA in order to exclude a prostate malignancy with LEFT seminal vesicle involvement. 6. Small BILATERAL inguinal hernias containing fat, LEFT larger than RIGHT. 7. Likely benign LEFT adrenal adenoma. 8. Very small 3 mm nodule in the LATERAL segment of the visualized RIGHT MIDDLE LOBE. Consider non-emergent CT chest to exclude other nodules elsewhere. Aortic Atherosclerosis (ICD10-I70.0). Electronically Signed   By: Evangeline Dakin M.D.   On: 02/04/2019 19:59   Dg Ercp Biliary & Pancreatic Ducts  Result Date: 02/05/2019 CLINICAL DATA:  83 year old male with a history of choledocholithiasis EXAM: ERCP TECHNIQUE: Multiple spot images obtained with the fluoroscopic device and submitted for interpretation post-procedure. FLUOROSCOPY TIME:  Fluoroscopy Time:  1 minutes 22 seconds COMPARISON:  None. FINDINGS: Limited intraoperative fluoroscopic spot images during ERCP. Endoscope projects over the upper abdomen with partial opacification of the extrahepatic biliary ducts. Deployment of a retrieval balloon. IMPRESSION: Limited images during ERCP demonstrates treatment of choledocholithiasis with deployment of a retrieval balloon. Please refer to the dictated operative report for full details of  intraoperative findings and procedure. Electronically Signed   By: Corrie Mckusick D.O.   On: 02/05/2019 15:16    Anti-infectives: Anti-infectives (From admission, onward)   Start     Dose/Rate Route Frequency Ordered Stop   02/05/19 1200  ampicillin-sulbactam (UNASYN) 1.5 g in sodium chloride 0.9 % 100 mL IVPB     1.5 g 200 mL/hr over 30 Minutes Intravenous Every 6 hours 02/05/19 0737     02/05/19 0600  piperacillin-tazobactam (ZOSYN) IVPB 3.375 g  Status:  Discontinued     3.375 g 12.5 mL/hr over 240 Minutes Intravenous Every 8 hours 02/05/19 0412 02/05/19 0737   02/04/19 2115  piperacillin-tazobactam (ZOSYN) IVPB 3.375 g     3.375 g 100 mL/hr over 30 Minutes Intravenous  Once 02/04/19 2101 02/04/19 2206      Assessment/Plan: s/p Procedure(s) with comments: ENDOSCOPIC RETROGRADE CHOLANGIOPANCREATOGRAPHY (ERCP) (N/A) SPHINCTEROTOMY - balloon sweep plan for lap chole today. Risks and benefits of the surgery as well as some of the technical aspects discussed with pt and he understands and wishes to proceed  LOS: 0 days    Autumn Messing III 02/06/2019

## 2019-02-07 ENCOUNTER — Inpatient Hospital Stay (HOSPITAL_COMMUNITY): Payer: PPO

## 2019-02-07 ENCOUNTER — Inpatient Hospital Stay (HOSPITAL_COMMUNITY): Payer: PPO | Admitting: Anesthesiology

## 2019-02-07 ENCOUNTER — Encounter (HOSPITAL_COMMUNITY)
Admission: EM | Disposition: A | Discharge status: Home / Self Care | Payer: Self-pay | Source: Home / Self Care | Attending: Internal Medicine

## 2019-02-07 ENCOUNTER — Encounter (HOSPITAL_COMMUNITY): Payer: Self-pay

## 2019-02-07 DIAGNOSIS — K810 Acute cholecystitis: Secondary | ICD-10-CM

## 2019-02-07 HISTORY — PX: CHOLECYSTECTOMY: SHX55

## 2019-02-07 LAB — COMPREHENSIVE METABOLIC PANEL
ALT: 39 U/L (ref 0–44)
AST: 20 U/L (ref 15–41)
Albumin: 3.1 g/dL — ABNORMAL LOW (ref 3.5–5.0)
Alkaline Phosphatase: 123 U/L (ref 38–126)
Anion gap: 10 (ref 5–15)
BUN: 10 mg/dL (ref 8–23)
CO2: 23 mmol/L (ref 22–32)
Calcium: 8.4 mg/dL — ABNORMAL LOW (ref 8.9–10.3)
Chloride: 98 mmol/L (ref 98–111)
Creatinine, Ser: 0.57 mg/dL — ABNORMAL LOW (ref 0.61–1.24)
GFR calc Af Amer: >60 mL/min (ref >60)
GFR calc non Af Amer: >60 mL/min (ref >60)
Glucose, Bld: 95 mg/dL (ref 70–99)
Potassium: 3.7 mmol/L (ref 3.5–5.1)
Sodium: 131 mmol/L — ABNORMAL LOW (ref 135–145)
Total Bilirubin: 1 mg/dL (ref 0.3–1.2)
Total Protein: 6.6 g/dL (ref 6.5–8.1)

## 2019-02-07 LAB — CBC
HCT: 30.2 % — ABNORMAL LOW (ref 39.0–52.0)
Hemoglobin: 9.9 g/dL — ABNORMAL LOW (ref 13.0–17.0)
MCH: 31.5 pg (ref 26.0–34.0)
MCHC: 32.8 g/dL (ref 30.0–36.0)
MCV: 96.2 fL (ref 80.0–100.0)
Platelets: 376 10*3/uL (ref 150–400)
RBC: 3.14 MIL/uL — ABNORMAL LOW (ref 4.22–5.81)
RDW: 13.2 % (ref 11.5–15.5)
WBC: 7.8 10*3/uL (ref 4.0–10.5)
nRBC: 0 % (ref 0.0–0.2)

## 2019-02-07 LAB — GLUCOSE, CAPILLARY
Glucose-Capillary: 113 mg/dL — ABNORMAL HIGH (ref 70–99)
Glucose-Capillary: 130 mg/dL — ABNORMAL HIGH (ref 70–99)
Glucose-Capillary: 137 mg/dL — ABNORMAL HIGH (ref 70–99)
Glucose-Capillary: 161 mg/dL — ABNORMAL HIGH (ref 70–99)
Glucose-Capillary: 229 mg/dL — ABNORMAL HIGH (ref 70–99)
Glucose-Capillary: 89 mg/dL (ref 70–99)
Glucose-Capillary: 97 mg/dL (ref 70–99)

## 2019-02-07 SURGERY — LAPAROSCOPIC CHOLECYSTECTOMY WITH INTRAOPERATIVE CHOLANGIOGRAM
Anesthesia: General

## 2019-02-07 SURGERY — LAPAROSCOPIC CHOLECYSTECTOMY
Anesthesia: General

## 2019-02-07 MED ORDER — FENTANYL CITRATE (PF) 100 MCG/2ML IJ SOLN: 25.0000 ug | INTRAMUSCULAR | Status: DC | PRN

## 2019-02-07 MED ORDER — ONDANSETRON HCL 4 MG/2ML IJ SOLN
INTRAMUSCULAR | Status: DC | PRN
  Administered 2019-02-07: 4 mg via INTRAVENOUS

## 2019-02-07 MED ORDER — ROCURONIUM BROMIDE 10 MG/ML (PF) SYRINGE
PREFILLED_SYRINGE | INTRAVENOUS | Status: DC | PRN
  Administered 2019-02-07: 50 mg via INTRAVENOUS

## 2019-02-07 MED ORDER — LACTATED RINGERS IV SOLN
INTRAVENOUS | Status: DC
  Administered 2019-02-07 (×2): via INTRAVENOUS

## 2019-02-07 MED ORDER — LIDOCAINE 2% (20 MG/ML) 5 ML SYRINGE
INTRAMUSCULAR | Status: DC | PRN
  Administered 2019-02-07: 100 mg via INTRAVENOUS

## 2019-02-07 MED ORDER — MORPHINE SULFATE (PF) 2 MG/ML IV SOLN: 1.0000 mg | INTRAVENOUS | Status: DC | PRN

## 2019-02-07 MED ORDER — LISINOPRIL 10 MG PO TABS
10.0000 mg | ORAL_TABLET | Freq: Every day | ORAL | Status: DC
  Administered 2019-02-07: 10 mg via ORAL
  Filled 2019-02-07: qty 1

## 2019-02-07 MED ORDER — DEXAMETHASONE SODIUM PHOSPHATE 10 MG/ML IJ SOLN
INTRAMUSCULAR | Status: DC | PRN
  Administered 2019-02-07: 10 mg via INTRAVENOUS

## 2019-02-07 MED ORDER — OXYCODONE HCL 5 MG PO TABS: 5.0000 mg | ORAL_TABLET | Freq: Once | ORAL | Status: DC | PRN

## 2019-02-07 MED ORDER — LACTATED RINGERS IR SOLN
Status: DC | PRN
  Administered 2019-02-07: 1000 mL

## 2019-02-07 MED ORDER — PROPOFOL 10 MG/ML IV BOLUS
INTRAVENOUS | Status: DC | PRN
  Administered 2019-02-07: 160 mg via INTRAVENOUS

## 2019-02-07 MED ORDER — ONDANSETRON HCL 4 MG/2ML IJ SOLN
INTRAMUSCULAR | Status: AC
  Filled 2019-02-07: qty 2

## 2019-02-07 MED ORDER — LIDOCAINE 2% (20 MG/ML) 5 ML SYRINGE
INTRAMUSCULAR | Status: AC
  Filled 2019-02-07: qty 5

## 2019-02-07 MED ORDER — BUPIVACAINE-EPINEPHRINE 0.5% -1:200000 IJ SOLN
INTRAMUSCULAR | Status: DC | PRN
  Administered 2019-02-07: 25 mL

## 2019-02-07 MED ORDER — PROPOFOL 10 MG/ML IV BOLUS
INTRAVENOUS | Status: AC
  Filled 2019-02-07: qty 20

## 2019-02-07 MED ORDER — SUGAMMADEX SODIUM 200 MG/2ML IV SOLN
INTRAVENOUS | Status: DC | PRN
  Administered 2019-02-07: 200 mg via INTRAVENOUS

## 2019-02-07 MED ORDER — EPHEDRINE SULFATE-NACL 50-0.9 MG/10ML-% IV SOSY
PREFILLED_SYRINGE | INTRAVENOUS | Status: DC | PRN
  Administered 2019-02-07: 10 mg via INTRAVENOUS

## 2019-02-07 MED ORDER — ROCURONIUM BROMIDE 10 MG/ML (PF) SYRINGE
PREFILLED_SYRINGE | INTRAVENOUS | Status: AC
  Filled 2019-02-07: qty 10

## 2019-02-07 MED ORDER — BUPIVACAINE-EPINEPHRINE 0.5% -1:200000 IJ SOLN
INTRAMUSCULAR | Status: AC
  Filled 2019-02-07: qty 1

## 2019-02-07 MED ORDER — DEXAMETHASONE SODIUM PHOSPHATE 10 MG/ML IJ SOLN
INTRAMUSCULAR | Status: AC
  Filled 2019-02-07: qty 1

## 2019-02-07 MED ORDER — AMLODIPINE BESYLATE 5 MG PO TABS
5.0000 mg | ORAL_TABLET | Freq: Every day | ORAL | Status: DC
  Administered 2019-02-07 – 2019-02-08 (×2): 5 mg via ORAL
  Filled 2019-02-07 (×3): qty 1

## 2019-02-07 MED ORDER — IOHEXOL 300 MG/ML  SOLN
INTRAMUSCULAR | Status: DC | PRN
  Administered 2019-02-07: 5 mL

## 2019-02-07 MED ORDER — ONDANSETRON HCL 4 MG/2ML IJ SOLN: 4.0000 mg | Freq: Once | INTRAMUSCULAR | Status: DC | PRN

## 2019-02-07 MED ORDER — MEPERIDINE HCL 50 MG/ML IJ SOLN: 6.2500 mg | INTRAMUSCULAR | Status: DC | PRN

## 2019-02-07 MED ORDER — OXYCODONE HCL 5 MG/5ML PO SOLN: 5.0000 mg | Freq: Once | ORAL | Status: DC | PRN

## 2019-02-07 MED ORDER — ACETAMINOPHEN 10 MG/ML IV SOLN: 1000.0000 mg | Freq: Once | INTRAVENOUS | Status: DC | PRN

## 2019-02-07 MED ORDER — FENTANYL CITRATE (PF) 250 MCG/5ML IJ SOLN
INTRAMUSCULAR | Status: AC
  Filled 2019-02-07: qty 5

## 2019-02-07 MED ORDER — EPHEDRINE 5 MG/ML INJ
INTRAVENOUS | Status: AC
  Filled 2019-02-07: qty 10

## 2019-02-07 MED ORDER — FENTANYL CITRATE (PF) 100 MCG/2ML IJ SOLN
INTRAMUSCULAR | Status: DC | PRN
  Administered 2019-02-07 (×5): 50 ug via INTRAVENOUS

## 2019-02-07 SURGICAL SUPPLY — 33 items
ADH SKN CLS APL DERMABOND .7 (GAUZE/BANDAGES/DRESSINGS) ×1
APL PRP STRL LF DISP 70% ISPRP (MISCELLANEOUS) ×1
APPLIER CLIP 5 13 M/L LIGAMAX5 (MISCELLANEOUS) ×3
APR CLP MED LRG 5 ANG JAW (MISCELLANEOUS) ×1
BAG SPEC RTRVL LRG 6X4 10 (ENDOMECHANICALS) ×1
CABLE HIGH FREQUENCY MONO STRZ (ELECTRODE) ×3 IMPLANT
CATH REDDICK CHOLANGI 4FR 50CM (CATHETERS) ×3 IMPLANT
CHLORAPREP W/TINT 26 (MISCELLANEOUS) ×3 IMPLANT
CLIP APPLIE 5 13 M/L LIGAMAX5 (MISCELLANEOUS) ×1 IMPLANT
COVER MAYO STAND STRL (DRAPES) ×3 IMPLANT
COVER WAND RF STERILE (DRAPES) IMPLANT
DECANTER SPIKE VIAL GLASS SM (MISCELLANEOUS) ×3 IMPLANT
DERMABOND ADVANCED (GAUZE/BANDAGES/DRESSINGS) ×2
DERMABOND ADVANCED .7 DNX12 (GAUZE/BANDAGES/DRESSINGS) ×1 IMPLANT
DRAPE C-ARM 42X120 X-RAY (DRAPES) ×3 IMPLANT
ELECT REM PT RETURN 15FT ADLT (MISCELLANEOUS) ×3 IMPLANT
GLOVE BIO SURGEON STRL SZ7.5 (GLOVE) ×3 IMPLANT
GOWN STRL REUS W/TWL XL LVL3 (GOWN DISPOSABLE) ×9 IMPLANT
HEMOSTAT SURGICEL 4X8 (HEMOSTASIS) IMPLANT
IV CATH 14GX2 1/4 (CATHETERS) ×3 IMPLANT
KIT BASIN OR (CUSTOM PROCEDURE TRAY) ×3 IMPLANT
KIT TURNOVER KIT A (KITS) ×2 IMPLANT
POUCH SPECIMEN RETRIEVAL 10MM (ENDOMECHANICALS) ×3 IMPLANT
SCISSORS LAP 5X35 DISP (ENDOMECHANICALS) ×3 IMPLANT
SET IRRIG TUBING LAPAROSCOPIC (IRRIGATION / IRRIGATOR) ×3 IMPLANT
SET TUBE SMOKE EVAC HIGH FLOW (TUBING) ×3 IMPLANT
SLEEVE XCEL OPT CAN 5 100 (ENDOMECHANICALS) ×6 IMPLANT
SUT MNCRL AB 4-0 PS2 18 (SUTURE) ×3 IMPLANT
TOWEL OR 17X26 10 PK STRL BLUE (TOWEL DISPOSABLE) ×3 IMPLANT
TOWEL OR NON WOVEN STRL DISP B (DISPOSABLE) ×3 IMPLANT
TRAY LAPAROSCOPIC (CUSTOM PROCEDURE TRAY) ×3 IMPLANT
TROCAR BLADELESS OPT 5 100 (ENDOMECHANICALS) ×3 IMPLANT
TROCAR XCEL BLUNT TIP 100MML (ENDOMECHANICALS) ×3 IMPLANT

## 2019-02-07 NOTE — Interval H&P Note (Signed)
History and Physical Interval Note:  02/07/2019 9:16 AM  Timothy Farrell  has presented today for surgery, with the diagnosis of cholelthiasis.  The various methods of treatment have been discussed with the patient and family. After consideration of risks, benefits and other options for treatment, the patient has consented to  Procedure(s): LAPAROSCOPIC CHOLECYSTECTOMY WITH INTRAOPERATIVE CHOLANGIOGRAM (N/A) as a surgical intervention.  The patient's history has been reviewed, patient examined, no change in status, stable for surgery.  I have reviewed the patient's chart and labs.  Questions were answered to the patient's satisfaction.     Autumn Messing III

## 2019-02-07 NOTE — Progress Notes (Signed)
PROGRESS NOTE    Timothy Farrell  [id number] DOB: August 26, 1935 DOA: 02/04/2019 PCP: Myrlene Broker, MD    Brief Narrative:  83 y.o. M with hx HTN, DM and OA with recent left TKA who presented with few weeks nausea, anorexia and sweats.  6 days ago, presented to OSH with fever, sweats, diagnosed with cellulitis, discharged with cephalexin.  Since then, developed new abdominal aching pain and lightheadedness, and so came to ER.  In the ER, CT showed a choledocholithiasis and leukocytosis. Started on antibiotics.  GI were consulted who recommended ERCP.  Assessment & Plan:   Principal Problem:   Choledocholithiasis Active Problems:   Type 2 diabetes mellitus ([facility])   Hypertension associated with diabetes ([facility])   Hyperlipidemia associated with type 2 diabetes mellitus ([facility])   Lung nodule seen on imaging study   Acute cholecystitis  Choledocholithiasis Suspicion for cholangitis Patient has had fevers, sweats, elevated transaminases, right upper quadrant pain, and leukocytosis. -Cont on IV fluids -IV morphine for pain, Zofran for nausea -Continue antibiotics, since narrowed to Unasyn -Consult to GI, appreciate expert cares -s/p ERCP 8/25, unremarkable with sphincterotomy performed -General Surgery following, pt since underwent lap chole today with minimal EBL  Hypertension -Had been well controlled, now suboptimally post-operatively -Continue carvedilol as tolerated - Will resume amlodipine, lisinopril  Diabetes -Blood glucose well controlled - Continue sliding scale insulin as needed -Continue statin as tolerated -Holding metformin while in hospital  Hyponatremia -Improved -repeat bmet in AM  Normocytic anemia No clinical blood loss.  Baseline hemoglobin unknown.  Hemoglobin 12.1 and admission. Hgb of 9.9 this AM. Minimal EBL per general surgey  DVT prophylaxis: SCD's Code Status: Full Family Communication: Pt in room, family not at bedside Disposition  Plan: Uncertain at this time  Consultants:   General Surgery  GI  Procedures:   ERCP 8/25  Lap chole 8/27  Antimicrobials: Anti-infectives (From admission, onward)   Start     Dose/Rate Route Frequency Ordered Stop   02/05/19 1200  ampicillin-sulbactam (UNASYN) 1.5 g in sodium chloride 0.9 % 100 mL IVPB  Status:  Discontinued     1.5 g 200 mL/hr over 30 Minutes Intravenous Every 6 hours 02/05/19 0737 02/07/19 1420   02/05/19 0600  piperacillin-tazobactam (ZOSYN) IVPB 3.375 g  Status:  Discontinued     3.375 g 12.5 mL/hr over 240 Minutes Intravenous Every 8 hours 02/05/19 0412 02/05/19 0737   02/04/19 2115  piperacillin-tazobactam (ZOSYN) IVPB 3.375 g     3.375 g 100 mL/hr over 30 Minutes Intravenous  Once 02/04/19 2101 02/04/19 2206      Subjective: When seen this AM, eager to have surgery so that he could go home  Objective: Vitals:   02/07/19 1345 02/07/19 1400 02/07/19 1410 02/07/19 1430  BP: (!) 165/72 (!) 167/69 (!) 162/70 (!) 169/74  Pulse: 63 (!) 59 (!) 59 (!) 59  Resp: 10 14 12 16   Temp:   97.7 F (36.5 C) 97.7 F (36.5 C)  TempSrc:    Oral  SpO2: 95% 92% 93% 94%  Weight:      Height:        Intake/Output Summary (Last 24 hours) at 02/07/2019 1748 Last data filed at 02/07/2019 1700 Gross per 24 hour  Intake 1973.99 ml  Output 2625 ml  Net -651.01 ml   Filed Weights   02/05/19 0502 02/06/19 0520 02/07/19 1105  Weight: 84.2 kg 83.6 kg 83.6 kg    Examination: General exam: Awake, laying in bed, in nad Respiratory  system: Normal respiratory effort, no wheezing Cardiovascular system: regular rate, s1, s2 Gastrointestinal system: Soft, nondistended, positive BS Central nervous system: CN2-12 grossly intact, strength intact Extremities: Perfused, no clubbing Skin: Normal skin turgor, no notable skin lesions seen Psychiatry: Mood normal // no visual hallucinations   Data Reviewed: I have personally reviewed following labs and imaging studies  CBC:  Recent Labs  Lab 02/04/19 1735 02/05/19 0030 02/06/19 0327 02/07/19 0256  WBC 15.6* 11.1* 7.2 7.8  NEUTROABS 13.5*  --   --   --   HGB 10.9* 9.7* 10.5* 9.9*  HCT 32.9* 29.1* 31.8* 30.2*  MCV 95.6 96.0 96.7 96.2  PLT 486* 482* 421* [id number]   Basic Metabolic Panel: Recent Labs  Lab 02/04/19 1735 02/05/19 0030 02/06/19 0327 02/07/19 0256  NA 129* 126* 131* 131*  K 4.5 4.1 4.8 3.7  CL 94* 92* 98 98  CO2 24 21* 24 23  GLUCOSE 130* 111* 149* 95  BUN 13 11 11 10   CREATININE 0.73 0.77 0.63 0.57*  CALCIUM 9.1 8.2* 8.9 8.4*   GFR: Estimated Creatinine Clearance: 78.4 mL/min (A) (by C-G formula based on SCr of 0.57 mg/dL (L)). Liver Function Tests: Recent Labs  Lab 02/04/19 1735 02/05/19 0030 02/06/19 0327 02/07/19 0256  AST 82* 45* 26 20  ALT 81* 66* 52* 39  ALKPHOS 207* 177* 159* 123  BILITOT 1.1 0.9 0.7 1.0  PROT 7.4 6.6 7.2 6.6  ALBUMIN 3.7 3.2* 3.4* 3.1*   Recent Labs  Lab 02/04/19 1735  LIPASE 35   No results for input(s): AMMONIA in the last 168 hours. Coagulation Profile: Recent Labs  Lab 02/05/19 0030  INR 1.0   Cardiac Enzymes: No results for input(s): CKTOTAL, CKMB, CKMBINDEX, TROPONINI in the last 168 hours. BNP (last 3 results) No results for input(s): PROBNP in the last 8760 hours. HbA1C: No results for input(s): HGBA1C in the last 72 hours. CBG: Recent Labs  Lab 02/07/19 0355 02/07/19 0721 02/07/19 1058 02/07/19 1341 02/07/19 1616  GLUCAP 89 97 113* 130* 137*   Lipid Profile: No results for input(s): CHOL, HDL, LDLCALC, TRIG, CHOLHDL, LDLDIRECT in the last 72 hours. Thyroid Function Tests: No results for input(s): TSH, T4TOTAL, FREET4, T3FREE, THYROIDAB in the last 72 hours. Anemia Panel: No results for input(s): VITAMINB12, FOLATE, FERRITIN, TIBC, IRON, RETICCTPCT in the last 72 hours. Sepsis Labs: No results for input(s): PROCALCITON, LATICACIDVEN in the last 168 hours.  Recent Results (from the past 240 hour(s))  SARS CORONAVIRUS  2 (TAT 6-12 HRS) Nasal Swab Aptima Multi Swab     Status: None   Collection Time: 02/04/19  9:21 PM   Specimen: Aptima Multi Swab; Nasal Swab  Result Value Ref Range Status   SARS Coronavirus 2 NEGATIVE NEGATIVE Final    Comment: (NOTE) SARS-CoV-2 target nucleic acids are NOT DETECTED. The SARS-CoV-2 RNA is generally detectable in upper and lower respiratory specimens during the acute phase of infection. Negative results do not preclude SARS-CoV-2 infection, do not rule out co-infections with other pathogens, and should not be used as the sole basis for treatment or other patient management decisions. Negative results must be combined with clinical observations, patient history, and epidemiological information. The expected result is Negative. Fact Sheet for Patients: SugarRoll.be Fact Sheet for Healthcare Providers: https://www.woods-mathews.com/ This test is not yet approved or cleared by the [place] FDA and  has been authorized for detection and/or diagnosis of SARS-CoV-2 by FDA under an Emergency Use Authorization (EUA). This EUA will remain  in  effect (meaning this test can be used) for the duration of the COVID-19 declaration under Section 56 4(b)(1) of the Act, 21 U.S.C. section 360bbb-3(b)(1), unless the authorization is terminated or revoked sooner. Performed at [facility], [place] [address], [place], [place] [address]   Culture, blood (routine x 2)     Status: None (Preliminary result)   Collection Time: 02/05/19 12:30 AM   Specimen: BLOOD  Result Value Ref Range Status   Specimen Description   Final    BLOOD RIGHT ANTECUBITAL Performed at [facility] [address], [place], [place] [address]    Special Requests   Final    BOTTLES DRAWN AEROBIC AND ANAEROBIC Blood Culture adequate volume Performed at [facility] [address], [place], [place] [address]    Culture   Final     NO GROWTH 2 DAYS Performed at [facility] [address] [place], [place] [address]    Report Status PENDING  Incomplete  Culture, blood (routine x 2)     Status: None (Preliminary result)   Collection Time: 02/05/19  1:02 AM   Specimen: BLOOD  Result Value Ref Range Status   Specimen Description   Final    BLOOD BLOOD LEFT HAND Performed at [facility] [address], [place], [place] [address]    Special Requests   Final    BOTTLES DRAWN AEROBIC AND ANAEROBIC Blood Culture adequate volume Performed at [facility] [address] [place], [place] [address]    Culture   Final    NO GROWTH 2 DAYS Performed at [facility] [address], [place],  [address]    Report Status PENDING  Incomplete  MRSA PCR Screening     Status: Abnormal   Collection Time: 02/06/19  4:08 AM   Specimen: Nasal Mucosa; Nasopharyngeal  Result Value Ref Range Status   MRSA by PCR POSITIVE (A) NEGATIVE Final    Comment:        The GeneXpert MRSA Assay (FDA approved for NASAL specimens only), is one component of a comprehensive MRSA colonization surveillance program. It is not intended to diagnose MRSA infection nor to guide or monitor treatment for MRSA infections. RESULT CALLED TO, READ BACK BY AND VERIFIED WITH: FRADY,H @ [id number] ON [id number] BY POTEAT,S Performed at [facility] [address] [place],  [address]      Radiology Studies: Dg Cholangiogram Operative  Result Date: 02/07/2019 CLINICAL DATA:  83 year old male with a history of cholelithiasis EXAM: INTRAOPERATIVE CHOLANGIOGRAM TECHNIQUE: Cholangiographic images from the C-arm fluoroscopic device were submitted for interpretation post-operatively. Please see the procedural report for the amount of contrast and the fluoroscopy time utilized. COMPARISON:  None. FINDINGS: Surgical instruments project over the upper abdomen. There is cannulation of the  cystic duct/gallbladder neck, with antegrade infusion of contrast. Caliber of the extrahepatic ductal system borderline enlarged No definite filling defect within the extrahepatic ducts identified. Free flow of contrast across the ampulla. IMPRESSION: Intraoperative cholangiogram demonstrates extrahepatic biliary ducts borderline enlarged, with no definite filling defects identified. Free flow of contrast across the ampulla. Please refer to the dictated operative report for full details of intraoperative findings and procedure Electronically Signed   By: Corrie Mckusick D.O.   On: 02/07/2019 13:34    Scheduled Meds: . carvedilol  12.5 mg Oral BID WC  . Chlorhexidine Gluconate Cloth  6 each Topical Q0600  . insulin aspart  0-9  Units Subcutaneous Q4H  . mupirocin ointment  1 application Nasal BID  . pantoprazole  40 mg Oral Daily  . pravastatin  40 mg Oral q1800   Continuous Infusions: . lactated ringers 10 mL/hr at 02/07/19 1050     LOS: 1 day   Marylu Lund, MD [facility] Pager On Amion  If 7PM-7AM, please contact night-coverage 02/07/2019, 5:48 PM

## 2019-02-07 NOTE — Anesthesia Procedure Notes (Signed)
Procedure Name: Intubation Date/Time: 02/07/2019 12:20 PM Performed by: Elaina Cara D, CRNA Pre-anesthesia Checklist: Patient identified, Emergency Drugs available, Suction available and Patient being monitored Patient Re-evaluated:Patient Re-evaluated prior to induction Oxygen Delivery Method: Circle system utilized Preoxygenation: Pre-oxygenation with 100% oxygen Induction Type: IV induction Ventilation: Mask ventilation without difficulty Laryngoscope Size: Mac and 4 Grade View: Grade II Tube type: Oral Tube size: 7.5 mm Number of attempts: 1 Airway Equipment and Method: Stylet Placement Confirmation: ETT inserted through vocal cords under direct vision,  positive ETCO2 and breath sounds checked- equal and bilateral Secured at: 22 cm Tube secured with: Tape Dental Injury: Teeth and Oropharynx as per pre-operative assessment

## 2019-02-07 NOTE — Anesthesia Postprocedure Evaluation (Signed)
Anesthesia Post Note  Patient: Timothy Farrell  Procedure(s) Performed: LAPAROSCOPIC CHOLECYSTECTOMY WITH INTRAOPERATIVE CHOLANGIOGRAM (N/A )     Patient location during evaluation: PACU Anesthesia Type: General Level of consciousness: awake Pain management: pain level controlled Vital Signs Assessment: post-procedure vital signs reviewed and stable Respiratory status: spontaneous breathing, nonlabored ventilation, respiratory function stable and patient connected to nasal cannula oxygen Cardiovascular status: blood pressure returned to baseline and stable Postop Assessment: no apparent nausea or vomiting Anesthetic complications: no    Last Vitals:  Vitals:   02/07/19 1410 02/07/19 1430  BP: (!) 162/70 (!) 169/74  Pulse: (!) 59 (!) 59  Resp: 12 16  Temp: 36.5 C 36.5 C  SpO2: 93% 94%    Last Pain:  Vitals:   02/07/19 1430  TempSrc: Oral  PainSc: 0-No pain                 Ryan P Ellender

## 2019-02-07 NOTE — Progress Notes (Signed)
PT Cancellation Note  Patient Details Name: ELSON TWEEDLE MRN: [id number] DOB: 1936/03/16   Cancelled Treatment:    Reason Eval/Treat Not Completed: Patient at procedure or test/unavailable pt at lap chole procedure, cancelled yesterday. PT to check back as schedule allows.   Julien Girt, PT Acute Rehabilitation Services Pager [phone]  Office [phone]    Roxine Caddy D Elonda Husky 02/07/2019, 12:24 PM

## 2019-02-07 NOTE — Anesthesia Preprocedure Evaluation (Signed)
Anesthesia Evaluation  Patient identified by MRN, date of birth, ID band Patient awake    Reviewed: Allergy & Precautions, NPO status , Patient's Chart, lab work & pertinent test results, reviewed documented beta blocker date and time   History of Anesthesia Complications Negative for: history of anesthetic complications  Airway Mallampati: II  TM Distance: >3 FB Neck ROM: Full    Dental  (+) Dental Advisory Given, Teeth Intact   Pulmonary neg pulmonary ROS,    Pulmonary exam normal        Cardiovascular hypertension, Pt. on home beta blockers and Pt. on medications (-) anginaNormal cardiovascular exam     Neuro/Psych negative neurological ROS  negative psych ROS   GI/Hepatic Neg liver ROS, GERD  Medicated and Controlled, Cholelithiasis    Endo/Other  diabetes, Type 2, Oral Hypoglycemic Agents Hyponatremia   Renal/GU negative Renal ROS     Musculoskeletal  (+) Arthritis ,   Abdominal   Peds  Hematology  (+) anemia ,   Anesthesia Other Findings   Reproductive/Obstetrics                             Anesthesia Physical  Anesthesia Plan  ASA: II  Anesthesia Plan: General   Post-op Pain Management:    Induction: Intravenous  PONV Risk Score and Plan: 4 or greater and Treatment may vary due to age or medical condition, Ondansetron and Dexamethasone  Airway Management Planned: Oral ETT  Additional Equipment: None  Intra-op Plan:   Post-operative Plan: Extubation in OR  Informed Consent: I have reviewed the patients History and Physical, chart, labs and discussed the procedure including the risks, benefits and alternatives for the proposed anesthesia with the patient or authorized representative who has indicated his/her understanding and acceptance.     Dental advisory given  Plan Discussed with: CRNA and Anesthesiologist  Anesthesia Plan Comments:          Anesthesia Quick Evaluation

## 2019-02-07 NOTE — Evaluation (Signed)
Physical Therapy Evaluation Patient Details Name: Timothy Farrell MRN: [id number] DOB: 1936-04-13 Today's Date: 02/07/2019   History of Present Illness  83 yo male admitted to ED on 8/24 with abdominal pain, s/p lap chole on 8/27. Pt with recent hospitalization for L TKR 01/14/19. PMH includes DMII, HTN, HLD, GERD.  Clinical Impression   Pt presents with mild L knee pain, moderate abdominal pain s/p lap chole, increased time and effort to perform mobility tasks. Pt to benefit from acute PT to address deficits. Pt ambulated 900 ft with RW with supervision level of assist, no unsteadiness noted. Pt with L knee flexion ROM ~100*, pt has been performing TKR exercises in bed. PT recommending pt return to OPPT to address LLE, but pt is doing very well after lap chole today. PT to progress mobility as tolerated, and will continue to follow acutely.      Follow Up Recommendations Outpatient PT    Equipment Recommendations  None recommended by PT    Recommendations for Other Services       Precautions / Restrictions Precautions Precautions: Fall Restrictions Weight Bearing Restrictions: No Other Position/Activity Restrictions: WBAT      Mobility  Bed Mobility Overal bed mobility: Needs Assistance Bed Mobility: Supine to Sit;Sit to Supine     Supine to sit: Min guard;HOB elevated Sit to supine: Min guard   General bed mobility comments: min guard for safety, verbal cuing for log roll technique for abdominal comfort and protection.  Transfers Overall transfer level: Needs assistance Equipment used: Rolling walker (2 wheeled) Transfers: Sit to/from Stand Sit to Stand: Supervision         General transfer comment: supervision for safety, pt with proper hand placement when rising.  Ambulation/Gait Ambulation/Gait assistance: Supervision Gait Distance (Feet): 900 Feet Assistive device: Rolling walker (2 wheeled) Gait Pattern/deviations: Step-through pattern;Decreased stride  length Gait velocity: decr   General Gait Details: supervision for safety, pt with good form and upright posture during ambulation.  Stairs            Wheelchair Mobility    Modified Rankin (Stroke Patients Only)       Balance Overall balance assessment: Mild deficits observed, not formally tested                                           Pertinent Vitals/Pain Pain Assessment: 0-10 Pain Score: 4  Pain Location: L knee Pain Descriptors / Indicators: Dull;Aching Pain Intervention(s): Limited activity within patient's tolerance;Monitored during session;Repositioned    Home Living Family/patient expects to be discharged to:: Private residence Living Arrangements: Spouse/significant other Available Help at Discharge: Family Type of Home: House Home Access: Stairs to enter Entrance Stairs-Rails: Right Entrance Stairs-Number of Steps: 3 Home Layout: One level;Laundry or work area in [place]: Environmental consultant - 2 wheels;Cane - single point      Prior Function Level of Independence: Independent with assistive device(s)         Comments: pt reports using RW for ambulation PTA, states he was going to OPPT and progressing well with TKR rehab.     Hand Dominance   Dominant Hand: Right    Extremity/Trunk Assessment   Upper Extremity Assessment Upper Extremity Assessment: Overall WFL for tasks assessed    Lower Extremity Assessment Lower Extremity Assessment: Overall WFL for tasks assessed;LLE deficits/detail LLE Deficits / Details: s/p TKR 8/3; able to perform LAQ  without quad lag, no evidence of instability or unsteadiness in standing LLE Sensation: WNL    Cervical / Trunk Assessment Cervical / Trunk Assessment: Normal  Communication   Communication: No difficulties  Cognition Arousal/Alertness: Awake/alert Behavior During Therapy: WFL for tasks assessed/performed Overall Cognitive Status: Within Functional Limits for tasks  assessed                                        General Comments      Exercises     Assessment/Plan    PT Assessment Patient needs continued PT services  PT Problem List Decreased mobility;Decreased range of motion;Decreased activity tolerance;Decreased balance;Decreased knowledge of use of DME;Pain       PT Treatment Interventions DME instruction;Gait training;Functional mobility training;Balance training;Therapeutic exercise;Patient/family education;Therapeutic activities    PT Goals (Current goals can be found in the Care Plan section)  Acute Rehab PT Goals Patient Stated Goal: regain PLOF/independence PT Goal Formulation: With patient/family Time For Goal Achievement: 02/21/19 Potential to Achieve Goals: Good    Frequency 7X/week   Barriers to discharge        Co-evaluation               AM-PAC PT "6 Clicks" Mobility  Outcome Measure Help needed turning from your back to your side while in a flat bed without using bedrails?: A Little Help needed moving from lying on your back to sitting on the side of a flat bed without using bedrails?: A Little Help needed moving to and from a bed to a chair (including a wheelchair)?: A Little Help needed standing up from a chair using your arms (e.g., wheelchair or bedside chair)?: None Help needed to walk in hospital room?: None Help needed climbing 3-5 steps with a railing? : A Little 6 Click Score: 20    End of Session Equipment Utilized During Treatment: Gait belt Activity Tolerance: Patient tolerated treatment well Patient left: with call bell/phone within reach;in bed;with family/visitor present;with SCD's reapplied Nurse Communication: Mobility status PT Visit Diagnosis: Other abnormalities of gait and mobility (R26.89)    Time: [id number] PT Time Calculation (min) (ACUTE ONLY): 23 min   Charges:   PT Evaluation $PT Eval Low Complexity: 1 Low PT Treatments $Gait Training: 8-22 mins         Julien Girt, PT Acute Rehabilitation Services Pager [phone]  Office [phone]  Roxine Caddy D Elonda Husky 02/07/2019, 5:44 PM

## 2019-02-07 NOTE — Transfer of Care (Signed)
Immediate Anesthesia Transfer of Care Note  Patient: FRASER VLAHOS  Procedure(s) Performed: LAPAROSCOPIC CHOLECYSTECTOMY WITH INTRAOPERATIVE CHOLANGIOGRAM (N/A )  Patient Location: PACU  Anesthesia Type:General  Level of Consciousness: awake, alert  and oriented  Airway & Oxygen Therapy: Patient Spontanous Breathing and Patient connected to face mask oxygen  Post-op Assessment: Report given to RN and Post -op Vital signs reviewed and stable  Post vital signs: Reviewed and stable  Last Vitals:  Vitals Value Taken Time  BP    Temp    Pulse 64 02/07/19 1339  Resp 12 02/07/19 1339  SpO2 99 % 02/07/19 1339  Vitals shown include unvalidated device data.  Last Pain:  Vitals:   02/07/19 1103  TempSrc:   PainSc: 0-No pain         Complications: No apparent anesthesia complications

## 2019-02-07 NOTE — Op Note (Signed)
02/04/2019 - 02/07/2019  1:21 PM  PATIENT:  Timothy Farrell  83 y.o. male  PRE-OPERATIVE DIAGNOSIS:  cholelthiasis  POST-OPERATIVE DIAGNOSIS:  cholelthiasis  PROCEDURE:  Procedure(s): LAPAROSCOPIC CHOLECYSTECTOMY WITH INTRAOPERATIVE CHOLANGIOGRAM (N/A)  SURGEON:  Surgeon(s) and Role:    * Jovita Kussmaul, MD - Primary  PHYSICIAN ASSISTANT:   ASSISTANTS: Modena Jansky, PA   ANESTHESIA:   local and general  EBL:  minimal   BLOOD ADMINISTERED:none  DRAINS: none   LOCAL MEDICATIONS USED:  MARCAINE     SPECIMEN:  Source of Specimen:  gallbladder  DISPOSITION OF SPECIMEN:  PATHOLOGY  COUNTS:  YES  TOURNIQUET:  * No tourniquets in log *  DICTATION: .Dragon Dictation     Procedure: After informed consent was obtained the patient was brought to the operating room and placed in the supine position on the operating room table. After adequate induction of general anesthesia the patient's abdomen was prepped with ChloraPrep allowed to dry and draped in usual sterile manner. An appropriate timeout was performed. The area below the umbilicus was infiltrated with quarter percent  Marcaine. A small incision was made with a 15 blade knife. The incision was carried down through the subcutaneous tissue bluntly with a hemostat and [employer] retractors. The linea alba was identified. The linea alba was incised with a 15 blade knife and each side was grasped with Coker clamps. The preperitoneal space was then probed with a hemostat until the peritoneum was opened and access was gained to the abdominal cavity. A 0 Vicryl pursestring stitch was placed in the fascia surrounding the opening. A Hassan cannula was then placed through the opening and anchored in place with the previously placed Vicryl purse string stitch. The abdomen was insufflated with carbon dioxide without difficulty. A laparoscope was inserted through the [facility] cannula in the right upper quadrant was inspected. Next the epigastric  region was infiltrated with % Marcaine. A small incision was made with a 15 blade knife. A 5 mm port was placed bluntly through this incision into the abdominal cavity under direct vision. Next 2 sites were chosen laterally on the right side of the abdomen for placement of 5 mm ports. Each of these areas was infiltrated with quarter percent Marcaine. Small stab incisions were made with a 15 blade knife. 5 mm ports were then placed bluntly through these incisions into the abdominal cavity under direct vision without difficulty. A blunt grasper was placed through the lateralmost 5 mm port and used to grasp the dome of the gallbladder and elevated anteriorly and superiorly. Another blunt grasper was placed through the other 5 mm port and used to retract the body and neck of the gallbladder. A dissector was placed through the epigastric port and using the electrocautery the peritoneal reflection at the gallbladder neck was opened. Blunt dissection was then carried out in this area until the gallbladder neck-cystic duct junction was readily identified and a good window was created. A single clip was placed on the gallbladder neck. A small  ductotomy was made just below the clip with laparoscopic scissors. A 14-gauge Angiocath was then placed through the anterior abdominal wall under direct vision. A Reddick cholangiogram catheter was then placed through the Angiocath and flushed. The catheter was then placed in the cystic duct and anchored in place with a clip. A cholangiogram was obtained that showed no filling defects good emptying into the duodenum an adequate length on the cystic duct. The anchoring clip and catheters were then removed from the  patient. 3 clips were placed proximally on the cystic duct and the duct was divided between the 2 sets of clips. Posterior to this the cystic artery was identified and again dissected bluntly in a circumferential manner until a good window  was created. 2 clips were placed  proximally and one distally on the artery and the artery was divided between the 2 sets of clips. Next a laparoscopic hook cautery device was used to separate the gallbladder from the liver bed. Prior to completely detaching the gallbladder from the liver bed the liver bed was inspected and several small bleeding points were coagulated with the electrocautery until the area was completely hemostatic. The gallbladder was then detached the rest of it from the liver bed without difficulty. A laparoscopic bag was inserted through the hassan port. The laparoscope was moved to the epigastric port. The gallbladder was placed within the bag and the bag was sealed.  The bag with the gallbladder was then removed with the [facility] cannula through the infraumbilical port without difficulty. The fascial defect was then closed with the previously placed Vicryl pursestring stitch as well as with another figure-of-eight 0 Vicryl stitch. The liver bed was inspected again and found to be hemostatic. The abdomen was irrigated with copious amounts of saline until the effluent was clear. The ports were then removed under direct vision without difficulty and were found to be hemostatic. The gas was allowed to escape. The skin incisions were all closed with interrupted 4-0 Monocryl subcuticular stitches. Dermabond dressings were applied. The patient tolerated the procedure well. At the end of the case all needle sponge and instrument counts were correct. The patient was then awakened and taken to recovery in stable condition  PLAN OF CARE: Admit for overnight observation  PATIENT DISPOSITION:  PACU - hemodynamically stable.   Delay start of Pharmacological VTE agent (>24hrs) due to surgical blood loss or risk of bleeding: no

## 2019-02-08 ENCOUNTER — Encounter (HOSPITAL_COMMUNITY): Payer: Self-pay | Admitting: General Surgery

## 2019-02-08 LAB — GLUCOSE, CAPILLARY
Glucose-Capillary: 140 mg/dL — ABNORMAL HIGH (ref 70–99)
Glucose-Capillary: 157 mg/dL — ABNORMAL HIGH (ref 70–99)
Glucose-Capillary: 178 mg/dL — ABNORMAL HIGH (ref 70–99)

## 2019-02-08 LAB — COMPREHENSIVE METABOLIC PANEL
ALT: 58 U/L — ABNORMAL HIGH (ref 0–44)
AST: 39 U/L (ref 15–41)
Albumin: 3.4 g/dL — ABNORMAL LOW (ref 3.5–5.0)
Alkaline Phosphatase: 140 U/L — ABNORMAL HIGH (ref 38–126)
Anion gap: 11 (ref 5–15)
BUN: 15 mg/dL (ref 8–23)
CO2: 23 mmol/L (ref 22–32)
Calcium: 9 mg/dL (ref 8.9–10.3)
Chloride: 98 mmol/L (ref 98–111)
Creatinine, Ser: 0.65 mg/dL (ref 0.61–1.24)
GFR calc Af Amer: >60 mL/min (ref >60)
GFR calc non Af Amer: >60 mL/min (ref >60)
Glucose, Bld: 144 mg/dL — ABNORMAL HIGH (ref 70–99)
Potassium: 4.3 mmol/L (ref 3.5–5.1)
Sodium: 132 mmol/L — ABNORMAL LOW (ref 135–145)
Total Bilirubin: 0.9 mg/dL (ref 0.3–1.2)
Total Protein: 7.2 g/dL (ref 6.5–8.1)

## 2019-02-08 LAB — CBC
HCT: 34.2 % — ABNORMAL LOW (ref 39.0–52.0)
Hemoglobin: 11.2 g/dL — ABNORMAL LOW (ref 13.0–17.0)
MCH: 31.2 pg (ref 26.0–34.0)
MCHC: 32.7 g/dL (ref 30.0–36.0)
MCV: 95.3 fL (ref 80.0–100.0)
Platelets: 377 10*3/uL (ref 150–400)
RBC: 3.59 MIL/uL — ABNORMAL LOW (ref 4.22–5.81)
RDW: 12.8 % (ref 11.5–15.5)
WBC: 7.8 10*3/uL (ref 4.0–10.5)
nRBC: 0 % (ref 0.0–0.2)

## 2019-02-08 MED ORDER — OXYCODONE HCL 5 MG PO TABS: 5.0000 mg | ORAL_TABLET | Freq: Four times a day (QID) | ORAL | 0 refills | Status: AC | PRN

## 2019-02-08 NOTE — Progress Notes (Addendum)
1 Day Post-Op  Subjective: CC: Doing well. He reports that his pain is controlled. Has some mild pain around incision sites. Tolerating his diet without any N/V. He has mobilized since surgery. He has voided without difficulty.   Objective: Vital signs in last 24 hours: Temp:  [97.4 F (36.3 C)-98 F (36.7 C)] 97.7 F (36.5 C) (08/28 0504) Pulse Rate:  [59-78] 60 (08/28 0504) Resp:  [10-16] 16 (08/28 0504) BP: (137-169)/(69-84) 152/82 (08/28 0504) SpO2:  [92 %-99 %] 98 % (08/28 0504) Weight:  [80.9 kg-83.6 kg] 80.9 kg (08/28 0504) Last BM Date: 02/05/19  Intake/Output from previous day: 08/27 0701 - 08/28 0700 In: 2049.6 [P.O.:730; I.V.:1213.5; IV Piggyback:106.1] Out: 2575 [Urine:2325; Stool:250] Intake/Output this shift: Total I/O In: -  Out: 200 [Urine:200]  PE: Gen:  Alert, NAD, pleasant Pulm:  Normal rate and effort Abd: Soft, ND, appropriately tender around laparoscopic incisions. +BS. Incisions with glue intact appears well and are without drainage, bleeding, or signs of infection.  Ext:  No LE edema Psych: A&Ox3  Skin: no rashes noted, warm and dry  Lab Results:  Recent Labs    02/07/19 0256 02/08/19 0245  WBC 7.8 7.8  HGB 9.9* 11.2*  HCT 30.2* 34.2*  PLT 376 377   BMET Recent Labs    02/07/19 0256 02/08/19 0245  NA 131* 132*  K 3.7 4.3  CL 98 98  CO2 23 23  GLUCOSE 95 144*  BUN 10 15  CREATININE 0.57* 0.65  CALCIUM 8.4* 9.0   PT/INR No results for input(s): LABPROT, INR in the last 72 hours. CMP     Component Value Date/Time   NA 132 (L) 02/08/2019 0245   K 4.3 02/08/2019 0245   CL 98 02/08/2019 0245   CO2 23 02/08/2019 0245   GLUCOSE 144 (H) 02/08/2019 0245   BUN 15 02/08/2019 0245   CREATININE 0.65 02/08/2019 0245   CALCIUM 9.0 02/08/2019 0245   PROT 7.2 02/08/2019 0245   ALBUMIN 3.4 (L) 02/08/2019 0245   AST 39 02/08/2019 0245   ALT 58 (H) 02/08/2019 0245   ALKPHOS 140 (H) 02/08/2019 0245   BILITOT 0.9 02/08/2019 0245   GFRNONAA >60 02/08/2019 0245   GFRAA >60 02/08/2019 0245   Lipase     Component Value Date/Time   LIPASE 35 02/04/2019 1735       Studies/Results: Dg Cholangiogram Operative  Result Date: 02/07/2019 CLINICAL DATA:  83 year old male with a history of cholelithiasis EXAM: INTRAOPERATIVE CHOLANGIOGRAM TECHNIQUE: Cholangiographic images from the C-arm fluoroscopic device were submitted for interpretation post-operatively. Please see the procedural report for the amount of contrast and the fluoroscopy time utilized. COMPARISON:  None. FINDINGS: Surgical instruments project over the upper abdomen. There is cannulation of the cystic duct/gallbladder neck, with antegrade infusion of contrast. Caliber of the extrahepatic ductal system borderline enlarged No definite filling defect within the extrahepatic ducts identified. Free flow of contrast across the ampulla. IMPRESSION: Intraoperative cholangiogram demonstrates extrahepatic biliary ducts borderline enlarged, with no definite filling defects identified. Free flow of contrast across the ampulla. Please refer to the dictated operative report for full details of intraoperative findings and procedure Electronically Signed   By: Corrie Mckusick D.O.   On: 02/07/2019 13:34    Anti-infectives: Anti-infectives (From admission, onward)   Start     Dose/Rate Route Frequency Ordered Stop   02/05/19 1200  ampicillin-sulbactam (UNASYN) 1.5 g in sodium chloride 0.9 % 100 mL IVPB  Status:  Discontinued     1.5  g 200 mL/hr over 30 Minutes Intravenous Every 6 hours 02/05/19 0737 02/07/19 1420   02/05/19 0600  piperacillin-tazobactam (ZOSYN) IVPB 3.375 g  Status:  Discontinued     3.375 g 12.5 mL/hr over 240 Minutes Intravenous Every 8 hours 02/05/19 0412 02/05/19 0737   02/04/19 2115  piperacillin-tazobactam (ZOSYN) IVPB 3.375 g     3.375 g 100 mL/hr over 30 Minutes Intravenous  Once 02/04/19 2101 02/04/19 2206       Assessment/Plan HTN HLD DM GERD Hx  of recent left TKA on 01/14/2019 by Dr. Ronnie Derby  Choledocholithiasis Cholelithiasis  - Status post ERCP, 8/25 by Dr. Benson Norway with biliary sphincterotomy - S/p Lap Chole with IOC - Dr. Marlou Starks - 02/07/2019 - POD #1 - IOC negative. Alk Phos and ALT elevation likely reactive from surgery - No indication for further abx at d/c - On POD 1, the patient was voiding well, tolerating diet, ambulating, working well with therapies, pain well controlled, vital signs stable, incisions c/d/i and felt stable for discharge home.  ID - Unasyn 8/25-8/27 VTE - SCDs, okay for chemical prophylaxis from a surgical standpoint FEN - CM  Follow-up Information    Sur[facility], [place] Follow up on 02/19/2019.   Specialty: General Surgery Why: Your appointment is at 11:30 AM.  Be at the office 30 minutes early for check in.  Bring photo ID and insurance information. Contact information: [address] [place] [address] [phone]             LOS: 2 days    Jillyn Ledger , [facility] Surgery 02/08/2019, 9:37 AM Pager: [phone]

## 2019-02-08 NOTE — Discharge Instructions (Signed)
[facility] ______[facility] LAPAROSCOPIC SURGERY: POST OP INSTRUCTIONS Always review your discharge instruction sheet given to you by the facility where your surgery was performed. IF YOU HAVE DISABILITY OR FAMILY LEAVE FORMS, YOU MUST BRING THEM TO THE OFFICE FOR PROCESSING.   DO NOT GIVE THEM TO YOUR DOCTOR.  1. A prescription for pain medication may be given to you upon discharge.  Take your pain medication as prescribed, if needed.  If narcotic pain medicine is not needed, then you may take acetaminophen (Tylenol) or ibuprofen (Advil) as needed. 2. Take your usually prescribed medications unless otherwise directed. 3. If you need a refill on your pain medication, please contact your pharmacy.  They will contact our office to request authorization. Prescriptions will not be filled after 5pm or on week-ends. 4. You should follow a light diet the first few days after arrival home, such as soup and crackers, etc.  Be sure to include lots of fluids daily. 5. Most patients will experience some swelling and bruising in the area of the incisions.  Ice packs will help.  Swelling and bruising can take several days to resolve.  6. It is common to experience some constipation if taking pain medication after surgery.  Increasing fluid intake and taking a stool softener (such as Colace) will usually help or prevent this problem from occurring.  A mild laxative (Milk of Magnesia or Miralax) should be taken according to package instructions if there are no bowel movements after 48 hours. 7. Unless discharge instructions indicate otherwise, you may remove your bandages 24-48 hours after surgery, and you may shower at that time.  You may have steri-strips (small skin tapes) in place directly over the incision.  These strips should be left on the skin for 7-10 days.  If your surgeon used skin glue on the incision, you may shower in 24 hours.  The glue will flake off over the next 2-3 weeks.  Any sutures or  staples will be removed at the office during your follow-up visit. 8. ACTIVITIES:  You may resume regular (light) daily activities beginning the next day--such as daily self-care, walking, climbing stairs--gradually increasing activities as tolerated.  You may have sexual intercourse when it is comfortable. Please do not lift anything greater than 15 lbs for the next 2 weeks. Do not lift anything greater than 40 lbs for the next 4 weeks.  Refrain from any heavy lifting or straining until approved by your doctor. a. You may drive when you are no longer taking prescription pain medication, you can comfortably wear a seatbelt, and you can safely maneuver your car and apply brakes. b. RETURN TO WORK:  __________________________________________________________ 9. You should see your doctor in the office for a follow-up appointment approximately 2-3 weeks after your surgery.  Make sure that you call for this appointment within a day or two after you arrive home to insure a convenient appointment time. 10. OTHER INSTRUCTIONS: __________________________________________________________________________________________________________________________ __________________________________________________________________________________________________________________________ WHEN TO CALL YOUR DOCTOR: 1. Fever over 101.0 2. Inability to urinate 3. Continued bleeding from incision. 4. Increased pain, redness, or drainage from the incision. 5. Increasing abdominal pain  The clinic staff is available to answer your questions during regular business hours.  Please dont hesitate to call and ask to speak to one of the nurses for clinical concerns.  If you have a medical emergency, go to the nearest emergency room or call 911.  A surgeon from [facility] is always on call at the hospital. [address]  [address] [place], [place], [place]  [address] ? [address], [place], [place]   [address] [phone] ?  [phone] ? FAX [phone] Web site: [email or web address]     Managing Your Pain After Surgery Without Opioids    Thank you for participating in our program to help patients manage their pain after surgery without opioids. This is part of our effort to provide you with the best care possible, without exposing you or your family to the risk that opioids pose.  What pain can I expect after surgery? You can expect to have some pain after surgery. This is normal. The pain is typically worse the day after surgery, and quickly begins to get better. Many studies have found that many patients are able to manage their pain after surgery with Over-the-Counter (OTC) medications such as Tylenol and Motrin. If you have a condition that does not allow you to take Tylenol or Motrin, notify your surgical team.  How will I manage my pain? The best strategy for controlling your pain after surgery is around the clock pain control with Tylenol (acetaminophen) and Motrin (ibuprofen or Advil). Alternating these medications with each other allows you to maximize your pain control. In addition to Tylenol and Motrin, you can use heating pads or ice packs on your incisions to help reduce your pain.  How will I alternate your regular strength over-the-counter pain medication? You will take a dose of pain medication every three hours. ; Start by taking 650 mg of Tylenol (2 pills of 325 mg) ; 3 hours later take 600 mg of Motrin (3 pills of 200 mg) ; 3 hours after taking the Motrin take 650 mg of Tylenol ; 3 hours after that take 600 mg of Motrin.   - 1 -  See example - if your first dose of Tylenol is at 12:00 PM   12:00 PM Tylenol 650 mg (2 pills of 325 mg)  3:00 PM Motrin 600 mg (3 pills of 200 mg)  6:00 PM Tylenol 650 mg (2 pills of 325 mg)  9:00 PM Motrin 600 mg (3 pills of 200 mg)  Continue alternating every 3 hours   We recommend that you follow this schedule  around-the-clock for at least 3 days after surgery, or until you feel that it is no longer needed. Use the table on the last page of this handout to keep track of the medications you are taking. Important: Do not take more than 3000mg  of Tylenol or 3200mg  of Motrin in a 24-hour period. Do not take ibuprofen/Motrin if you have a history of bleeding stomach ulcers, severe kidney disease, &/or actively taking a blood thinner  What if I still have pain? If you have pain that is not controlled with the over-the-counter pain medications (Tylenol and Motrin or Advil) you might have what we call breakthrough pain. You will receive a prescription for a small amount of an opioid pain medication such as Oxycodone, Tramadol, or Tylenol with Codeine. Use these opioid pills in the first 24 hours after surgery if you have breakthrough pain. Do not take more than 1 pill every 4-6 hours.  If you still have uncontrolled pain after using all opioid pills, don't hesitate to call our staff using the number provided. We will help make sure you are managing your pain in the best way possible, and if necessary, we can provide a prescription for additional pain medication.   Day 1    Time  Name of Medication Number of  pills taken  Amount of Acetaminophen  Pain Level   Comments  AM PM       AM PM       AM PM       AM PM       AM PM       AM PM       AM PM       AM PM       Total Daily amount of Acetaminophen Do not take more than  3,000 mg per day      Day 2    Time  Name of Medication Number of pills taken  Amount of Acetaminophen  Pain Level   Comments  AM PM       AM PM       AM PM       AM PM       AM PM       AM PM       AM PM       AM PM       Total Daily amount of Acetaminophen Do not take more than  3,000 mg per day      Day 3    Time  Name of Medication Number of pills taken  Amount of Acetaminophen  Pain Level   Comments  AM PM       AM PM       AM PM       AM PM           AM PM       AM PM       AM PM       AM PM       Total Daily amount of Acetaminophen Do not take more than  3,000 mg per day      Day 4    Time  Name of Medication Number of pills taken  Amount of Acetaminophen  Pain Level   Comments  AM PM       AM PM       AM PM       AM PM       AM PM       AM PM       AM PM       AM PM       Total Daily amount of Acetaminophen Do not take more than  3,000 mg per day      Day 5    Time  Name of Medication Number of pills taken  Amount of Acetaminophen  Pain Level   Comments  AM PM       AM PM       AM PM       AM PM       AM PM       AM PM       AM PM       AM PM       Total Daily amount of Acetaminophen Do not take more than  3,000 mg per day       Day 6    Time  Name of Medication Number of pills taken  Amount of Acetaminophen  Pain Level  Comments  AM PM       AM PM       AM PM       AM PM       AM PM  AM PM       AM PM       AM PM       Total Daily amount of Acetaminophen Do not take more than  3,000 mg per day      Day 7    Time  Name of Medication Number of pills taken  Amount of Acetaminophen  Pain Level   Comments  AM PM       AM PM       AM PM       AM PM       AM PM       AM PM       AM PM       AM PM       Total Daily amount of Acetaminophen Do not take more than  3,000 mg per day        For additional information about how and where to safely dispose of unused opioid medications - [employer]  Disclaimer: This document contains information and/or instructional materials adapted from [place] for the typical patient with your condition. It does not replace medical advice from your health care provider because your experience may differ from that of the typical patient. Talk to your health care provider if you have any questions about this document, your condition or your treatment plan. Adapted from [place] If you have a gallbladder condition, you may have trouble digesting fats. Eating a low-fat diet can help reduce your symptoms, and may be helpful before and after having surgery to remove your gallbladder (cholecystectomy). Your health care provider may recommend that you work with a diet and nutrition specialist (dietitian) to help you reduce the amount of fat in your diet. What are tips for following this plan? General guidelines  Limit your fat intake to less than 30% of your total daily calories. If you eat around 1,800 calories each day, this is less than 60 grams (g) of fat per day.  Fat is an important part of a healthy diet. Eating a low-fat diet can make it hard to maintain a healthy body weight. Ask your dietitian how much fat, calories, and other nutrients you need each day.  Eat small, frequent meals throughout the day instead of three large meals.  Drink at least 8-10 cups of fluid a day. Drink enough fluid to keep your urine clear or pale yellow.  Limit alcohol intake to no more than 1 drink a day for nonpregnant women and 2 drinks a day for men. One drink equals 12 oz of beer, 5 oz of wine, or 1 oz of hard liquor. Reading food labels  Check Nutrition Facts on food labels for the amount of fat per serving. Choose foods with less than 3 grams of fat per serving. Shopping  Choose nonfat and low-fat healthy foods. Look for the words nonfat, low fat, or fat free.  Avoid buying processed or prepackaged foods. Cooking  Cook using low-fat methods, such as baking, broiling, grilling, or boiling.  Cook with small amounts of healthy fats, such as olive oil, grapeseed oil, canola oil, or sunflower oil. What foods are recommended?   All fresh, frozen, or canned fruits and vegetables.  Whole grains.  Low-fat or non-fat (skim) milk and yogurt.  Lean meat, skinless poultry, fish, eggs, and beans.  Low-fat protein supplement powders or drinks.  Spices and  herbs. What foods are not recommended?  High-fat foods. These include baked  goods, fast food, fatty cuts of meat, ice cream, french toast, sweet rolls, pizza, cheese bread, foods covered with butter, creamy sauces, or cheese.  Fried foods. These include french fries, tempura, battered fish, breaded chicken, fried breads, and sweets.  Foods with strong odors.  Foods that cause bloating and gas. Summary  A low-fat diet can be helpful if you have a gallbladder condition, or before and after gallbladder surgery.  Limit your fat intake to less than 30% of your total daily calories. This is about 60 g of fat if you eat 1,800 calories each day.  Eat small, frequent meals throughout the day instead of three large meals. This information is not intended to replace advice given to you by your health care provider. Make sure you discuss any questions you have with your health care provider. Document Released: 06/04/2013 Document Revised: 09/20/2018 Document Reviewed: 07/07/2016 Elsevier Patient Education  2020 [employer].

## 2019-02-08 NOTE — Care Management Important Message (Signed)
Important Message  Patient Details IM Letter given to Velva Harman RN to present to the Patient Name: Timothy Farrell MRN: [id number] Date of Birth: 1935/07/02   Medicare Important Message Given:  Yes     Kerin Salen 02/08/2019, 10:11 AM

## 2019-02-08 NOTE — Discharge Summary (Signed)
Physician Discharge Summary  Timothy Farrell [id number] DOB: 01-30-1936 DOA: 02/04/2019  PCP: Myrlene Broker, MD  Admit date: 02/04/2019 Discharge date: 02/08/2019  Admitted From: Home Disposition:  Home  Recommendations for Outpatient Follow-up:  1. Follow up with PCP in 1-2 weeks  Discharge Condition:Improved CODE STATUS:Full Diet recommendation: Diabetic   Brief/Interim Summary: 83 y.o.Mwith hx HTN, DM and OA with recent left TKA who presented with few weeks nausea, anorexia and sweats.  6 days ago, presented to OSH with fever, sweats, diagnosed with cellulitis, discharged with cephalexin. Since then, developed new abdominal aching pain and lightheadedness, and so came to ER.  In the ER, CT showed a choledocholithiasis and leukocytosis. Started on antibiotics. GI were consulted who recommended ERCP.  Discharge Diagnoses:  Principal Problem:   Choledocholithiasis Active Problems:   Type 2 diabetes mellitus ([place])   Hypertension associated with diabetes ([place])   Hyperlipidemia associated with type 2 diabetes mellitus ([facility])   Lung nodule seen on imaging study   Acute cholecystitis  Choledocholithiasis Suspicion for cholangitis Patient noted to have fevers, sweats, elevated transaminases, right upper quadrant pain, and leukocytosis. -Pt was continued on IV fluids - While in hospital, was continued on antibiotics, since narrowed to Unasyn -Consult to GI, appreciate expert cares -s/p ERCP 8/25, unremarkable with sphincterotomy performed -General Surgery following, pt since underwent lap chole 8/27 with minimal EBL  Hypertension -Had been well controlled, now suboptimally post-operatively -Continue carvedilol as tolerated -Will resume amlodipine, lisinopril  Diabetes -Blood glucose well controlled -Continue sliding scale insulin as needed -Continue statin as tolerated -Helding metformin while in hospital  Hyponatremia -Improved  Normocytic anemia No  clinical blood loss. Baseline hemoglobin unknown. Hemoglobin 12.1 and admission. Minimal EBL per Op note. Hgb of 11.2 at time of d/c.   Discharge Instructions   Allergies as of 02/08/2019   No Known Allergies     Medication List    TAKE these medications   acetaminophen 500 MG tablet Commonly known as: TYLENOL Take 500-1,000 mg by mouth every 6 (six) hours as needed for moderate pain or headache.   amLODipine 5 MG tablet Commonly known as: NORVASC Take 5 mg by mouth daily.   aspirin EC 81 MG tablet Take 81 mg by mouth daily. What changed: Another medication with the same name was removed. Continue taking this medication, and follow the directions you see here.   B COMPLEX-ZINC PO Take 1 tablet by mouth daily at 12 noon.   carvedilol 12.5 MG tablet Commonly known as: COREG Take 12.5 mg by mouth 2 (two) times daily with a meal.   CENTRUM SILVER 50+MEN PO Take 1 tablet by mouth daily.   COD LIVER OIL PO Take 1 capsule by mouth daily at 12 noon.   glyBURIDE 2.5 MG tablet Commonly known as: DIABETA Take 2.5 mg by mouth daily at 12 noon.   lisinopril 20 MG tablet Commonly known as: ZESTRIL Take 10 mg by mouth at bedtime.   lovastatin 40 MG tablet Commonly known as: MEVACOR Take 40 mg by mouth at bedtime.   metFORMIN 1000 MG tablet Commonly known as: GLUCOPHAGE Take 1,000 mg by mouth 2 (two) times a day.   methocarbamol 500 MG tablet Commonly known as: ROBAXIN Take 1-2 tablets (500-1,000 mg total) by mouth every 6 (six) hours as needed for muscle spasms.   omeprazole 20 MG tablet Commonly known as: PRILOSEC OTC Take 20 mg by mouth daily.   oxyCODONE 5 MG immediate release tablet Commonly known as: Oxy IR/ROXICODONE  Take 1 tablet (5 mg total) by mouth every 6 (six) hours as needed for breakthrough pain. What changed:   how much to take  reasons to take this   Probiotic Caps Take 1 capsule by mouth daily.   vitamin C 500 MG tablet Commonly known as:  ASCORBIC ACID Take 500 mg by mouth daily at 12 noon.      Follow-up Information    [facility] Follow up on 02/19/2019.   Specialty: General Surgery Why: Your appointment is at 11:30 AM.  Be at the office 30 minutes early for check in.  Bring photo ID and insurance information. Contact information: [place] [address] [phone]        Myrlene Broker, MD. Schedule an appointment as soon as possible for a visit in 2 week(s).   Specialty: Family Medicine Contact information: [place] [address] [phone]          No Known Allergies  Consultations:  General Surgery  GI  Procedures/Studies: Dg Cholangiogram Operative  Result Date: 02/07/2019 CLINICAL DATA:  83 year old male with a history of cholelithiasis EXAM: INTRAOPERATIVE CHOLANGIOGRAM TECHNIQUE: Cholangiographic images from the C-arm fluoroscopic device were submitted for interpretation post-operatively. Please see the procedural report for the amount of contrast and the fluoroscopy time utilized. COMPARISON:  None. FINDINGS: Surgical instruments project over the upper abdomen. There is cannulation of the cystic duct/gallbladder neck, with antegrade infusion of contrast. Caliber of the extrahepatic ductal system borderline enlarged No definite filling defect within the extrahepatic ducts identified. Free flow of contrast across the ampulla. IMPRESSION: Intraoperative cholangiogram demonstrates extrahepatic biliary ducts borderline enlarged, with no definite filling defects identified. Free flow of contrast across the ampulla. Please refer to the dictated operative report for full details of intraoperative findings and procedure Electronically Signed   By: Corrie Mckusick D.O.   On: 02/07/2019 13:34   Ct Abdomen Pelvis W Contrast  Result Date: 02/04/2019 CLINICAL DATA:  LEFT total knee arthroplasty 01/14/2019. Acute onset of generalized weakness,  generalized abdominal pain, fever and myalgias. Mild hypotension upon EMS arrival. EXAM: CT ABDOMEN AND PELVIS WITH CONTRAST TECHNIQUE: Multidetector CT imaging of the abdomen and pelvis was performed using the standard protocol following bolus administration of intravenous contrast. CONTRAST:  145mL OMNIPAQUE IOHEXOL 300 MG/ML IV. COMPARISON:  None. FINDINGS: Lower chest: Minimal scarring in the lingula. Very small (approximate 3 mm) nodule in the LATERAL segment RIGHT MIDDLE LOBE (series 4, image 10). Visualized lung bases otherwise clear. Heart size normal. Mild RIGHT coronary artery atherosclerosis. Hepatobiliary: Mild intra and extrahepatic biliary ductal dilation, the common duct measuring up to 13 mm diameter. Very small (approximate 2 mm) stone in the distal common bile duct, though the duct is dilated beyond this stone, so this stone is not causing obstruction. Multiple small calcified gallstones in the gallbladder, the largest on the order of 6 mm. No evidence of pericholecystic edema or inflammation. No focal hepatic parenchymal abnormalities. Pancreas: Markedly atrophic pancreatic body and tail. Calcifications involving the pancreatic head. No pancreatic mass or peripancreatic inflammation. Spleen: Normal in size and appearance. Adrenals/Urinary Tract: Approximate 1.3 x 1.1 x 0.9 cm low-attenuation nodule involving the LEFT adrenal gland. Normal appearing RIGHT adrenal gland. Benign simple cyst arising from the ANTERIOR LOWER pole of the RIGHT kidney measuring approximately 6.4 x 5.2 cm. No solid masses involving either kidney. No hydronephrosis. No opaque urinary tract calculi. Normal appearing decompressed urinary bladder. Stomach/Bowel: Moderate-sized hiatal hernia.  Stomach decompressed and otherwise normal in appearance. Normal-appearing small bowel. Diffuse colonic diverticulosis, most extensive in the sigmoid region, without evidence of acute diverticulitis. Liquid stool throughout the colon.  Mobile cecum positioned in the RIGHT UPPER QUADRANT. Appendix not conspicuous, but no pericecal inflammation. Vascular/Lymphatic: Moderate aorto-iliofemoral atherosclerosis without evidence of aneurysm. Normal-appearing portal venous and systemic venous systems. No pathologic lymphadenopathy. Reproductive: Moderate to marked prostate gland enlargement. Asymmetric enlargement of the LEFT seminal vesicles. Other: Small BILATERAL inguinal hernias containing fat, LEFT larger than RIGHT. Musculoskeletal: Multilevel degenerative disc disease and spondylosis throughout the lumbar spine. Facet degenerative changes throughout the lumbar spine. Desiccated disc extrusion at L5-S1 with INFERIOR migration of the desiccated disc fragment. LOWER thoracic spondylosis. No acute findings. IMPRESSION: 1. Choledocholithiasis, with a very small (approximate 2 mm) stone in the distal common bile duct; as the duct is dilated distal to this stone, it is not causing obstruction. 2. Cholelithiasis without CT evidence of acute cholecystitis. 3. Moderate-sized hiatal hernia. 4. Diffuse colonic diverticulosis without evidence of acute diverticulitis. Liquid stool throughout the colon. 5. Moderate to marked prostate gland enlargement. Asymmetric enlargement of the LEFT seminal vesicles. Correlate with PSA in order to exclude a prostate malignancy with LEFT seminal vesicle involvement. 6. Small BILATERAL inguinal hernias containing fat, LEFT larger than RIGHT. 7. Likely benign LEFT adrenal adenoma. 8. Very small 3 mm nodule in the LATERAL segment of the visualized RIGHT MIDDLE LOBE. Consider non-emergent CT chest to exclude other nodules elsewhere. Aortic Atherosclerosis (ICD10-I70.0). Electronically Signed   By: Evangeline Dakin M.D.   On: 02/04/2019 19:59   Dg Ercp Biliary & Pancreatic Ducts  Result Date: 02/05/2019 CLINICAL DATA:  83 year old male with a history of choledocholithiasis EXAM: ERCP TECHNIQUE: Multiple spot images obtained  with the fluoroscopic device and submitted for interpretation post-procedure. FLUOROSCOPY TIME:  Fluoroscopy Time:  1 minutes 22 seconds COMPARISON:  None. FINDINGS: Limited intraoperative fluoroscopic spot images during ERCP. Endoscope projects over the upper abdomen with partial opacification of the extrahepatic biliary ducts. Deployment of a retrieval balloon. IMPRESSION: Limited images during ERCP demonstrates treatment of choledocholithiasis with deployment of a retrieval balloon. Please refer to the dictated operative report for full details of intraoperative findings and procedure. Electronically Signed   By: Corrie Mckusick D.O.   On: 02/05/2019 15:16     Subjective: Eager to go home  Discharge Exam: Vitals:   02/07/19 2053 02/08/19 0504  BP: 137/84 (!) 152/82  Pulse: 78 60  Resp: 16 16  Temp: 98 F (36.7 C) 97.7 F (36.5 C)  SpO2: 96% 98%   Vitals:   02/07/19 1410 02/07/19 1430 02/07/19 2053 02/08/19 0504  BP: (!) 162/70 (!) 169/74 137/84 (!) 152/82  Pulse: (!) 59 (!) 59 78 60  Resp: 12 16 16 16   Temp: 97.7 F (36.5 C) 97.7 F (36.5 C) 98 F (36.7 C) 97.7 F (36.5 C)  TempSrc:  Oral Oral Oral  SpO2: 93% 94% 96% 98%  Weight:    80.9 kg  Height:        General: Pt is alert, awake, not in acute distress Cardiovascular: RRR, S1/S2 +, no rubs, no gallops Respiratory: CTA bilaterally, no wheezing, no rhonchi Abdominal: Soft, NT, ND, bowel sounds + Extremities: no edema, no cyanosis   The results of significant diagnostics from this hospitalization (including imaging, microbiology, ancillary and laboratory) are listed below for reference.     Microbiology: Recent Results (from the past 240 hour(s))  SARS CORONAVIRUS 2 (TAT 6-12 HRS) Nasal Swab Aptima  Multi Swab     Status: None   Collection Time: 02/04/19  9:21 PM   Specimen: Aptima Multi Swab; Nasal Swab  Result Value Ref Range Status   SARS Coronavirus 2 NEGATIVE NEGATIVE Final    Comment: (NOTE) SARS-CoV-2  target nucleic acids are NOT DETECTED. The SARS-CoV-2 RNA is generally detectable in upper and lower respiratory specimens during the acute phase of infection. Negative results do not preclude SARS-CoV-2 infection, do not rule out co-infections with other pathogens, and should not be used as the sole basis for treatment or other patient management decisions. Negative results must be combined with clinical observations, patient history, and epidemiological information. The expected result is Negative. Fact Sheet for Patients: SugarRoll.be Fact Sheet for Healthcare Providers: https://www.woods-mathews.com/ This test is not yet approved or cleared by the [place] FDA and  has been authorized for detection and/or diagnosis of SARS-CoV-2 by FDA under an Emergency Use Authorization (EUA). This EUA will remain  in effect (meaning this test can be used) for the duration of the COVID-19 declaration under Section 56 4(b)(1) of the Act, 21 U.S.C. section 360bbb-3(b)(1), unless the authorization is terminated or revoked sooner. Performed at [facility], [place] [address], [place], [place] [address]   Culture, blood (routine x 2)     Status: None (Preliminary result)   Collection Time: 02/05/19 12:30 AM   Specimen: BLOOD  Result Value Ref Range Status   Specimen Description   Final    BLOOD RIGHT ANTECUBITAL Performed at [facility] [address], [place], [place] [address]    Special Requests   Final    BOTTLES DRAWN AEROBIC AND ANAEROBIC Blood Culture adequate volume Performed at [facility] [address] [place], [place] [address]    Culture   Final    NO GROWTH 2 DAYS Performed at [facility] [address], [place], [place] [address]    Report Status PENDING  Incomplete  Culture, blood (routine x 2)     Status: None (Preliminary result)   Collection Time: 02/05/19  1:02 AM    Specimen: BLOOD  Result Value Ref Range Status   Specimen Description   Final    BLOOD BLOOD LEFT HAND Performed at [facility] [address] [place], [place] [address]    Special Requests   Final    BOTTLES DRAWN AEROBIC AND ANAEROBIC Blood Culture adequate volume Performed at [facility] [address] [place], [place] [address]    Culture   Final    NO GROWTH 2 DAYS Performed at [facility] [address] [place], [place] [address]    Report Status PENDING  Incomplete  MRSA PCR Screening     Status: Abnormal   Collection Time: 02/06/19  4:08 AM   Specimen: Nasal Mucosa; Nasopharyngeal  Result Value Ref Range Status   MRSA by PCR POSITIVE (A) NEGATIVE Final    Comment:        The GeneXpert MRSA Assay (FDA approved for NASAL specimens only), is one component of a comprehensive MRSA colonization surveillance program. It is not intended to diagnose MRSA infection nor to guide or monitor treatment for MRSA infections. RESULT CALLED TO, READ BACK BY AND VERIFIED WITH: FRADY,H @ [id number] ON [id number] BY POTEAT,S Performed at [facility] [address], [place], [place] [address]      Labs: BNP (last 3 results) No results for input(s): BNP in the  last 8760 hours. Basic Metabolic Panel: Recent Labs  Lab 02/04/19 1735 02/05/19 0030 02/06/19 0327 02/07/19 0256 02/08/19 0245  NA 129* 126* 131* 131* 132*  K 4.5 4.1 4.8 3.7 4.3  CL 94* 92* 98 98 98  CO2 24 21* 24 23 23   GLUCOSE 130* 111* 149* 95 144*  BUN 13 11 11 10 15   CREATININE 0.73 0.77 0.63 0.57* 0.65  CALCIUM 9.1 8.2* 8.9 8.4* 9.0   Liver Function Tests: Recent Labs  Lab 02/04/19 1735 02/05/19 0030 02/06/19 0327 02/07/19 0256 02/08/19 0245  AST 82* 45* 26 20 39  ALT 81* 66* 52* 39 58*  ALKPHOS 207* 177* 159* 123 140*  BILITOT 1.1 0.9 0.7 1.0 0.9  PROT 7.4 6.6 7.2 6.6 7.2  ALBUMIN 3.7 3.2* 3.4* 3.1* 3.4*   Recent Labs  Lab  02/04/19 1735  LIPASE 35   No results for input(s): AMMONIA in the last 168 hours. CBC: Recent Labs  Lab 02/04/19 1735 02/05/19 0030 02/06/19 0327 02/07/19 0256 02/08/19 0245  WBC 15.6* 11.1* 7.2 7.8 7.8  NEUTROABS 13.5*  --   --   --   --   HGB 10.9* 9.7* 10.5* 9.9* 11.2*  HCT 32.9* 29.1* 31.8* 30.2* 34.2*  MCV 95.6 96.0 96.7 96.2 95.3  PLT 486* 482* 421* 376 377   Cardiac Enzymes: No results for input(s): CKTOTAL, CKMB, CKMBINDEX, TROPONINI in the last 168 hours. BNP: Invalid input(s): POCBNP CBG: Recent Labs  Lab 02/07/19 1616 02/07/19 1959 02/07/19 2351 02/08/19 0410 02/08/19 0724  GLUCAP 137* 229* 161* 140* 157*   D-Dimer No results for input(s): DDIMER in the last 72 hours. Hgb A1c No results for input(s): HGBA1C in the last 72 hours. Lipid Profile No results for input(s): CHOL, HDL, LDLCALC, TRIG, CHOLHDL, LDLDIRECT in the last 72 hours. Thyroid function studies No results for input(s): TSH, T4TOTAL, T3FREE, THYROIDAB in the last 72 hours.  Invalid input(s): FREET3 Anemia work up No results for input(s): VITAMINB12, FOLATE, FERRITIN, TIBC, IRON, RETICCTPCT in the last 72 hours. Urinalysis    Component Value Date/Time   COLORURINE YELLOW 02/04/2019 1735   APPEARANCEUR CLEAR 02/04/2019 1735   LABSPEC 1.015 02/04/2019 1735   PHURINE 6.0 02/04/2019 1735   GLUCOSEU NEGATIVE 02/04/2019 1735   HGBUR NEGATIVE 02/04/2019 1735   [place] 02/04/2019 1735   KETONESUR NEGATIVE 02/04/2019 1735   PROTEINUR 30 (A) 02/04/2019 1735   NITRITE NEGATIVE 02/04/2019 1735   LEUKOCYTESUR NEGATIVE 02/04/2019 1735   Sepsis Labs Invalid input(s): PROCALCITONIN,  WBC,  LACTICIDVEN Microbiology Recent Results (from the past 240 hour(s))  SARS CORONAVIRUS 2 (TAT 6-12 HRS) Nasal Swab Aptima Multi Swab     Status: None   Collection Time: 02/04/19  9:21 PM   Specimen: Aptima Multi Swab; Nasal Swab  Result Value Ref Range Status   SARS Coronavirus 2 NEGATIVE  NEGATIVE Final    Comment: (NOTE) SARS-CoV-2 target nucleic acids are NOT DETECTED. The SARS-CoV-2 RNA is generally detectable in upper and lower respiratory specimens during the acute phase of infection. Negative results do not preclude SARS-CoV-2 infection, do not rule out co-infections with other pathogens, and should not be used as the sole basis for treatment or other patient management decisions. Negative results must be combined with clinical observations, patient history, and epidemiological information. The expected result is Negative. Fact Sheet for Patients: SugarRoll.be Fact Sheet for Healthcare Providers: https://www.woods-mathews.com/ This test is not yet approved or cleared by the [place] FDA and  has been authorized for  detection and/or diagnosis of SARS-CoV-2 by FDA under an Emergency Use Authorization (EUA). This EUA will remain  in effect (meaning this test can be used) for the duration of the COVID-19 declaration under Section 56 4(b)(1) of the Act, 21 U.S.C. section 360bbb-3(b)(1), unless the authorization is terminated or revoked sooner. Performed at [facility], [place] [address], [place], [place] [address]   Culture, blood (routine x 2)     Status: None (Preliminary result)   Collection Time: 02/05/19 12:30 AM   Specimen: BLOOD  Result Value Ref Range Status   Specimen Description   Final    BLOOD RIGHT ANTECUBITAL Performed at [facility] [address] [place], [place] [address]    Special Requests   Final    BOTTLES DRAWN AEROBIC AND ANAEROBIC Blood Culture adequate volume Performed at [facility] [address], [place], [place] [address]    Culture   Final    NO GROWTH 2 DAYS Performed at [facility] [address] [place], [place] [address]    Report Status PENDING  Incomplete  Culture, blood (routine x 2)     Status: None (Preliminary result)    Collection Time: 02/05/19  1:02 AM   Specimen: BLOOD  Result Value Ref Range Status   Specimen Description   Final    BLOOD BLOOD LEFT HAND Performed at [facility] [address], [place], [place] [address]    Special Requests   Final    BOTTLES DRAWN AEROBIC AND ANAEROBIC Blood Culture adequate volume Performed at [facility] [address] [place], [place] [address]    Culture   Final    NO GROWTH 2 DAYS Performed at [facility] [address], [place], [place] [address]    Report Status PENDING  Incomplete  MRSA PCR Screening     Status: Abnormal   Collection Time: 02/06/19  4:08 AM   Specimen: Nasal Mucosa; Nasopharyngeal  Result Value Ref Range Status   MRSA by PCR POSITIVE (A) NEGATIVE Final    Comment:        The GeneXpert MRSA Assay (FDA approved for NASAL specimens only), is one component of a comprehensive MRSA colonization surveillance program. It is not intended to diagnose MRSA infection nor to guide or monitor treatment for MRSA infections. RESULT CALLED TO, READ BACK BY AND VERIFIED WITH: FRADY,H @ [id number] ON [id number] BY POTEAT,S Performed at [facility] [address] [place],  [address]    Time spent: 30 min  SIGNED:   Marylu Lund, MD  [facility] 02/08/2019, 11:44 AM  If 7PM-7AM, please contact night-coverage

## 2019-02-10 LAB — CULTURE, BLOOD (ROUTINE X 2)
Culture: NO GROWTH
Culture: NO GROWTH
Special Requests: ADEQUATE
Special Requests: ADEQUATE

## 2019-02-21 DIAGNOSIS — D649 Anemia, unspecified: Secondary | ICD-10-CM | POA: Diagnosis not present

## 2019-02-21 DIAGNOSIS — E1165 Type 2 diabetes mellitus with hyperglycemia: Secondary | ICD-10-CM | POA: Diagnosis not present

## 2019-02-21 DIAGNOSIS — E871 Hypo-osmolality and hyponatremia: Secondary | ICD-10-CM | POA: Diagnosis not present

## 2019-02-21 DIAGNOSIS — Z09 Encounter for follow-up examination after completed treatment for conditions other than malignant neoplasm: Secondary | ICD-10-CM | POA: Diagnosis not present

## 2019-02-21 DIAGNOSIS — I10 Essential (primary) hypertension: Secondary | ICD-10-CM | POA: Diagnosis not present

## 2019-02-25 DIAGNOSIS — M62552 Muscle wasting and atrophy, not elsewhere classified, left thigh: Secondary | ICD-10-CM | POA: Diagnosis not present

## 2019-02-25 DIAGNOSIS — M25562 Pain in left knee: Secondary | ICD-10-CM | POA: Diagnosis not present

## 2019-02-25 DIAGNOSIS — M25462 Effusion, left knee: Secondary | ICD-10-CM | POA: Diagnosis not present

## 2019-02-25 DIAGNOSIS — Z96652 Presence of left artificial knee joint: Secondary | ICD-10-CM | POA: Diagnosis not present

## 2019-02-25 DIAGNOSIS — R2689 Other abnormalities of gait and mobility: Secondary | ICD-10-CM | POA: Diagnosis not present

## 2019-02-27 DIAGNOSIS — R2689 Other abnormalities of gait and mobility: Secondary | ICD-10-CM | POA: Diagnosis not present

## 2019-02-27 DIAGNOSIS — M25562 Pain in left knee: Secondary | ICD-10-CM | POA: Diagnosis not present

## 2019-02-27 DIAGNOSIS — Z96652 Presence of left artificial knee joint: Secondary | ICD-10-CM | POA: Diagnosis not present

## 2019-02-27 DIAGNOSIS — M62552 Muscle wasting and atrophy, not elsewhere classified, left thigh: Secondary | ICD-10-CM | POA: Diagnosis not present

## 2019-02-27 DIAGNOSIS — M25462 Effusion, left knee: Secondary | ICD-10-CM | POA: Diagnosis not present

## 2019-03-01 DIAGNOSIS — M25562 Pain in left knee: Secondary | ICD-10-CM | POA: Diagnosis not present

## 2019-03-01 DIAGNOSIS — M62552 Muscle wasting and atrophy, not elsewhere classified, left thigh: Secondary | ICD-10-CM | POA: Diagnosis not present

## 2019-03-01 DIAGNOSIS — R2689 Other abnormalities of gait and mobility: Secondary | ICD-10-CM | POA: Diagnosis not present

## 2019-03-01 DIAGNOSIS — M25462 Effusion, left knee: Secondary | ICD-10-CM | POA: Diagnosis not present

## 2019-03-01 DIAGNOSIS — Z96652 Presence of left artificial knee joint: Secondary | ICD-10-CM | POA: Diagnosis not present

## 2019-03-05 DIAGNOSIS — R2689 Other abnormalities of gait and mobility: Secondary | ICD-10-CM | POA: Diagnosis not present

## 2019-03-05 DIAGNOSIS — M25462 Effusion, left knee: Secondary | ICD-10-CM | POA: Diagnosis not present

## 2019-03-05 DIAGNOSIS — M25562 Pain in left knee: Secondary | ICD-10-CM | POA: Diagnosis not present

## 2019-03-05 DIAGNOSIS — Z96652 Presence of left artificial knee joint: Secondary | ICD-10-CM | POA: Diagnosis not present

## 2019-03-05 DIAGNOSIS — M62552 Muscle wasting and atrophy, not elsewhere classified, left thigh: Secondary | ICD-10-CM | POA: Diagnosis not present

## 2019-03-07 DIAGNOSIS — Z96652 Presence of left artificial knee joint: Secondary | ICD-10-CM | POA: Diagnosis not present

## 2019-03-07 DIAGNOSIS — M62552 Muscle wasting and atrophy, not elsewhere classified, left thigh: Secondary | ICD-10-CM | POA: Diagnosis not present

## 2019-03-07 DIAGNOSIS — M25462 Effusion, left knee: Secondary | ICD-10-CM | POA: Diagnosis not present

## 2019-03-07 DIAGNOSIS — M25562 Pain in left knee: Secondary | ICD-10-CM | POA: Diagnosis not present

## 2019-03-07 DIAGNOSIS — R2689 Other abnormalities of gait and mobility: Secondary | ICD-10-CM | POA: Diagnosis not present

## 2019-03-12 DIAGNOSIS — M25562 Pain in left knee: Secondary | ICD-10-CM | POA: Diagnosis not present

## 2019-03-12 DIAGNOSIS — R2689 Other abnormalities of gait and mobility: Secondary | ICD-10-CM | POA: Diagnosis not present

## 2019-03-12 DIAGNOSIS — M25462 Effusion, left knee: Secondary | ICD-10-CM | POA: Diagnosis not present

## 2019-03-12 DIAGNOSIS — Z96652 Presence of left artificial knee joint: Secondary | ICD-10-CM | POA: Diagnosis not present

## 2019-03-12 DIAGNOSIS — M62552 Muscle wasting and atrophy, not elsewhere classified, left thigh: Secondary | ICD-10-CM | POA: Diagnosis not present

## 2019-03-14 DIAGNOSIS — M25462 Effusion, left knee: Secondary | ICD-10-CM | POA: Diagnosis not present

## 2019-03-14 DIAGNOSIS — M25562 Pain in left knee: Secondary | ICD-10-CM | POA: Diagnosis not present

## 2019-03-14 DIAGNOSIS — R2689 Other abnormalities of gait and mobility: Secondary | ICD-10-CM | POA: Diagnosis not present

## 2019-03-14 DIAGNOSIS — M62552 Muscle wasting and atrophy, not elsewhere classified, left thigh: Secondary | ICD-10-CM | POA: Diagnosis not present

## 2019-03-14 DIAGNOSIS — Z96652 Presence of left artificial knee joint: Secondary | ICD-10-CM | POA: Diagnosis not present

## 2019-03-19 DIAGNOSIS — M62552 Muscle wasting and atrophy, not elsewhere classified, left thigh: Secondary | ICD-10-CM | POA: Diagnosis not present

## 2019-03-19 DIAGNOSIS — R2689 Other abnormalities of gait and mobility: Secondary | ICD-10-CM | POA: Diagnosis not present

## 2019-03-19 DIAGNOSIS — M25562 Pain in left knee: Secondary | ICD-10-CM | POA: Diagnosis not present

## 2019-03-19 DIAGNOSIS — Z96652 Presence of left artificial knee joint: Secondary | ICD-10-CM | POA: Diagnosis not present

## 2019-03-19 DIAGNOSIS — M25462 Effusion, left knee: Secondary | ICD-10-CM | POA: Diagnosis not present

## 2019-03-22 DIAGNOSIS — M25462 Effusion, left knee: Secondary | ICD-10-CM | POA: Diagnosis not present

## 2019-03-22 DIAGNOSIS — R2689 Other abnormalities of gait and mobility: Secondary | ICD-10-CM | POA: Diagnosis not present

## 2019-03-22 DIAGNOSIS — Z96652 Presence of left artificial knee joint: Secondary | ICD-10-CM | POA: Diagnosis not present

## 2019-03-22 DIAGNOSIS — M62552 Muscle wasting and atrophy, not elsewhere classified, left thigh: Secondary | ICD-10-CM | POA: Diagnosis not present

## 2019-03-22 DIAGNOSIS — M25562 Pain in left knee: Secondary | ICD-10-CM | POA: Diagnosis not present

## 2019-03-26 DIAGNOSIS — M25562 Pain in left knee: Secondary | ICD-10-CM | POA: Diagnosis not present

## 2019-03-26 DIAGNOSIS — M62552 Muscle wasting and atrophy, not elsewhere classified, left thigh: Secondary | ICD-10-CM | POA: Diagnosis not present

## 2019-03-26 DIAGNOSIS — R2689 Other abnormalities of gait and mobility: Secondary | ICD-10-CM | POA: Diagnosis not present

## 2019-03-26 DIAGNOSIS — M25462 Effusion, left knee: Secondary | ICD-10-CM | POA: Diagnosis not present

## 2019-03-26 DIAGNOSIS — Z96652 Presence of left artificial knee joint: Secondary | ICD-10-CM | POA: Diagnosis not present

## 2019-03-28 DIAGNOSIS — D649 Anemia, unspecified: Secondary | ICD-10-CM | POA: Diagnosis not present

## 2019-03-28 DIAGNOSIS — E871 Hypo-osmolality and hyponatremia: Secondary | ICD-10-CM | POA: Diagnosis not present

## 2019-04-03 DIAGNOSIS — M25462 Effusion, left knee: Secondary | ICD-10-CM | POA: Diagnosis not present

## 2019-04-03 DIAGNOSIS — M62552 Muscle wasting and atrophy, not elsewhere classified, left thigh: Secondary | ICD-10-CM | POA: Diagnosis not present

## 2019-04-03 DIAGNOSIS — R2689 Other abnormalities of gait and mobility: Secondary | ICD-10-CM | POA: Diagnosis not present

## 2019-04-03 DIAGNOSIS — M25562 Pain in left knee: Secondary | ICD-10-CM | POA: Diagnosis not present

## 2019-04-03 DIAGNOSIS — Z96652 Presence of left artificial knee joint: Secondary | ICD-10-CM | POA: Diagnosis not present

## 2019-05-02 DIAGNOSIS — Z96652 Presence of left artificial knee joint: Secondary | ICD-10-CM | POA: Diagnosis not present

## 2019-08-12 DIAGNOSIS — K219 Gastro-esophageal reflux disease without esophagitis: Secondary | ICD-10-CM | POA: Diagnosis not present

## 2019-08-12 DIAGNOSIS — N138 Other obstructive and reflux uropathy: Secondary | ICD-10-CM | POA: Diagnosis not present

## 2019-08-12 DIAGNOSIS — E1165 Type 2 diabetes mellitus with hyperglycemia: Secondary | ICD-10-CM | POA: Diagnosis not present

## 2019-08-12 DIAGNOSIS — Z8669 Personal history of other diseases of the nervous system and sense organs: Secondary | ICD-10-CM | POA: Diagnosis not present

## 2019-08-12 DIAGNOSIS — I10 Essential (primary) hypertension: Secondary | ICD-10-CM | POA: Diagnosis not present

## 2019-08-12 DIAGNOSIS — E782 Mixed hyperlipidemia: Secondary | ICD-10-CM | POA: Diagnosis not present

## 2019-08-12 DIAGNOSIS — N401 Enlarged prostate with lower urinary tract symptoms: Secondary | ICD-10-CM | POA: Diagnosis not present

## 2019-10-18 DIAGNOSIS — L97521 Non-pressure chronic ulcer of other part of left foot limited to breakdown of skin: Secondary | ICD-10-CM | POA: Diagnosis not present

## 2019-10-18 DIAGNOSIS — E11621 Type 2 diabetes mellitus with foot ulcer: Secondary | ICD-10-CM | POA: Diagnosis not present

## 2019-10-22 DIAGNOSIS — E1142 Type 2 diabetes mellitus with diabetic polyneuropathy: Secondary | ICD-10-CM | POA: Diagnosis not present

## 2019-10-22 DIAGNOSIS — L97521 Non-pressure chronic ulcer of other part of left foot limited to breakdown of skin: Secondary | ICD-10-CM | POA: Diagnosis not present

## 2019-10-22 DIAGNOSIS — M2041 Other hammer toe(s) (acquired), right foot: Secondary | ICD-10-CM | POA: Diagnosis not present

## 2019-10-22 DIAGNOSIS — M2042 Other hammer toe(s) (acquired), left foot: Secondary | ICD-10-CM | POA: Diagnosis not present

## 2019-10-22 DIAGNOSIS — E11621 Type 2 diabetes mellitus with foot ulcer: Secondary | ICD-10-CM | POA: Diagnosis not present

## 2019-11-07 DIAGNOSIS — M2042 Other hammer toe(s) (acquired), left foot: Secondary | ICD-10-CM | POA: Diagnosis not present

## 2019-11-07 DIAGNOSIS — E1142 Type 2 diabetes mellitus with diabetic polyneuropathy: Secondary | ICD-10-CM | POA: Diagnosis not present

## 2019-11-07 DIAGNOSIS — E11621 Type 2 diabetes mellitus with foot ulcer: Secondary | ICD-10-CM | POA: Diagnosis not present

## 2019-11-07 DIAGNOSIS — L97521 Non-pressure chronic ulcer of other part of left foot limited to breakdown of skin: Secondary | ICD-10-CM | POA: Diagnosis not present

## 2019-11-07 DIAGNOSIS — M2041 Other hammer toe(s) (acquired), right foot: Secondary | ICD-10-CM | POA: Diagnosis not present

## 2019-12-07 DIAGNOSIS — J189 Pneumonia, unspecified organism: Secondary | ICD-10-CM | POA: Diagnosis not present

## 2019-12-07 DIAGNOSIS — R509 Fever, unspecified: Secondary | ICD-10-CM | POA: Diagnosis not present

## 2019-12-10 DIAGNOSIS — I82442 Acute embolism and thrombosis of left tibial vein: Secondary | ICD-10-CM | POA: Diagnosis not present

## 2019-12-10 DIAGNOSIS — M79662 Pain in left lower leg: Secondary | ICD-10-CM | POA: Diagnosis not present

## 2019-12-10 DIAGNOSIS — R600 Localized edema: Secondary | ICD-10-CM | POA: Diagnosis not present

## 2019-12-10 DIAGNOSIS — M7989 Other specified soft tissue disorders: Secondary | ICD-10-CM | POA: Diagnosis not present

## 2019-12-10 DIAGNOSIS — L03116 Cellulitis of left lower limb: Secondary | ICD-10-CM | POA: Diagnosis not present

## 2019-12-10 DIAGNOSIS — M79605 Pain in left leg: Secondary | ICD-10-CM | POA: Diagnosis not present

## 2020-01-07 DIAGNOSIS — L97521 Non-pressure chronic ulcer of other part of left foot limited to breakdown of skin: Secondary | ICD-10-CM | POA: Diagnosis not present

## 2020-01-07 DIAGNOSIS — E1142 Type 2 diabetes mellitus with diabetic polyneuropathy: Secondary | ICD-10-CM | POA: Diagnosis not present

## 2020-01-07 DIAGNOSIS — M2041 Other hammer toe(s) (acquired), right foot: Secondary | ICD-10-CM | POA: Diagnosis not present

## 2020-01-07 DIAGNOSIS — L84 Corns and callosities: Secondary | ICD-10-CM | POA: Diagnosis not present

## 2020-01-07 DIAGNOSIS — M2042 Other hammer toe(s) (acquired), left foot: Secondary | ICD-10-CM | POA: Diagnosis not present

## 2020-01-07 DIAGNOSIS — E11621 Type 2 diabetes mellitus with foot ulcer: Secondary | ICD-10-CM | POA: Diagnosis not present

## 2020-01-31 DIAGNOSIS — H524 Presbyopia: Secondary | ICD-10-CM | POA: Diagnosis not present

## 2020-02-13 DIAGNOSIS — K219 Gastro-esophageal reflux disease without esophagitis: Secondary | ICD-10-CM | POA: Diagnosis not present

## 2020-02-13 DIAGNOSIS — Z1211 Encounter for screening for malignant neoplasm of colon: Secondary | ICD-10-CM | POA: Diagnosis not present

## 2020-02-13 DIAGNOSIS — E1165 Type 2 diabetes mellitus with hyperglycemia: Secondary | ICD-10-CM | POA: Diagnosis not present

## 2020-02-13 DIAGNOSIS — Z125 Encounter for screening for malignant neoplasm of prostate: Secondary | ICD-10-CM | POA: Diagnosis not present

## 2020-02-13 DIAGNOSIS — Z0000 Encounter for general adult medical examination without abnormal findings: Secondary | ICD-10-CM | POA: Diagnosis not present

## 2020-02-13 DIAGNOSIS — E782 Mixed hyperlipidemia: Secondary | ICD-10-CM | POA: Diagnosis not present

## 2020-02-13 DIAGNOSIS — Z7984 Long term (current) use of oral hypoglycemic drugs: Secondary | ICD-10-CM | POA: Diagnosis not present

## 2020-02-13 DIAGNOSIS — I10 Essential (primary) hypertension: Secondary | ICD-10-CM | POA: Diagnosis not present

## 2020-03-10 DIAGNOSIS — Z20828 Contact with and (suspected) exposure to other viral communicable diseases: Secondary | ICD-10-CM | POA: Diagnosis not present

## 2020-03-10 DIAGNOSIS — J218 Acute bronchiolitis due to other specified organisms: Secondary | ICD-10-CM | POA: Diagnosis not present

## 2020-04-09 DIAGNOSIS — Z23 Encounter for immunization: Secondary | ICD-10-CM | POA: Diagnosis not present

## 2020-06-11 DIAGNOSIS — I82422 Acute embolism and thrombosis of left iliac vein: Secondary | ICD-10-CM | POA: Diagnosis not present

## 2020-06-11 DIAGNOSIS — M79662 Pain in left lower leg: Secondary | ICD-10-CM | POA: Diagnosis not present

## 2020-06-11 DIAGNOSIS — M79605 Pain in left leg: Secondary | ICD-10-CM | POA: Diagnosis not present

## 2020-08-07 DIAGNOSIS — L570 Actinic keratosis: Secondary | ICD-10-CM | POA: Diagnosis not present

## 2020-08-07 DIAGNOSIS — I8310 Varicose veins of unspecified lower extremity with inflammation: Secondary | ICD-10-CM | POA: Diagnosis not present

## 2020-08-07 DIAGNOSIS — R600 Localized edema: Secondary | ICD-10-CM | POA: Diagnosis not present

## 2020-08-19 DIAGNOSIS — E1165 Type 2 diabetes mellitus with hyperglycemia: Secondary | ICD-10-CM | POA: Diagnosis not present

## 2020-08-19 DIAGNOSIS — E1142 Type 2 diabetes mellitus with diabetic polyneuropathy: Secondary | ICD-10-CM | POA: Diagnosis not present

## 2020-10-16 DIAGNOSIS — I8310 Varicose veins of unspecified lower extremity with inflammation: Secondary | ICD-10-CM | POA: Diagnosis not present

## 2020-10-16 DIAGNOSIS — L570 Actinic keratosis: Secondary | ICD-10-CM | POA: Diagnosis not present

## 2020-10-16 DIAGNOSIS — R600 Localized edema: Secondary | ICD-10-CM | POA: Diagnosis not present

## 2020-11-24 IMAGING — CT CT ABDOMEN AND PELVIS WITH CONTRAST
2 of 5 series · 14 of 46 positions shown, 16 images · IV contrast (OMNIPAQUE 300)
Comparison: None.

CLINICAL DATA: LEFT total knee arthroplasty 01/14/2019. Acute onset
of generalized weakness, generalized abdominal pain, fever and
myalgias. Mild hypotension upon EMS arrival.

EXAM:
CT ABDOMEN AND PELVIS WITH CONTRAST
TECHNIQUE: Multidetector CT imaging of the abdomen and pelvis was performed
using the standard protocol following bolus administration of
intravenous contrast.
CONTRAST:  100mL OMNIPAQUE IOHEXOL 300 MG/ML IV.

[Series 2: axial st · axial · 0.77mm/px · z∈[+1093,+1523]mm · 11 of 102 slices shown, 13 images]
[im 8/102  soft-tissue]
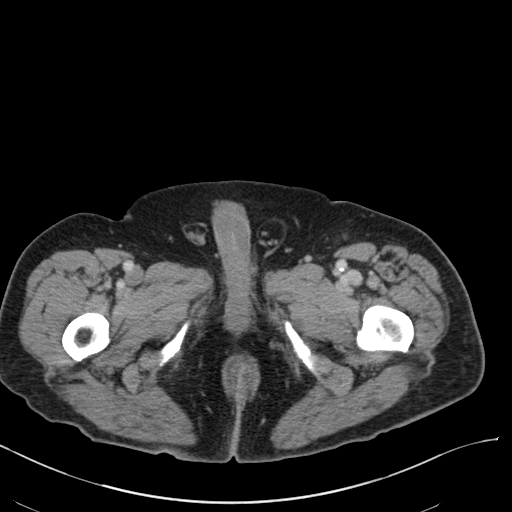
[im 8/102  bone]
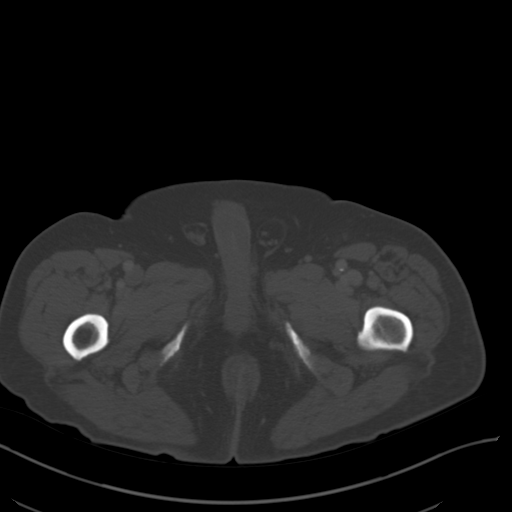
[im 15/102  soft-tissue]
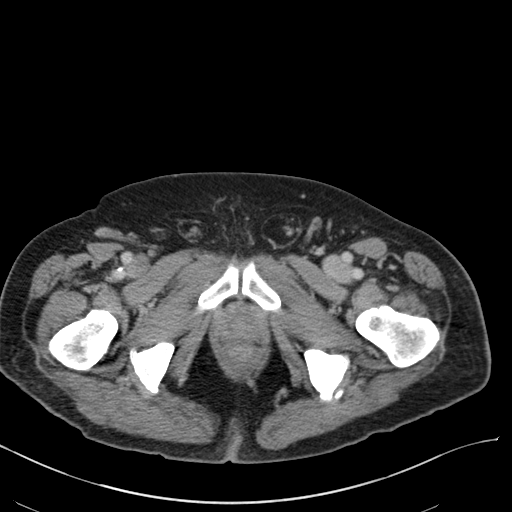
[im 22/102  soft-tissue]
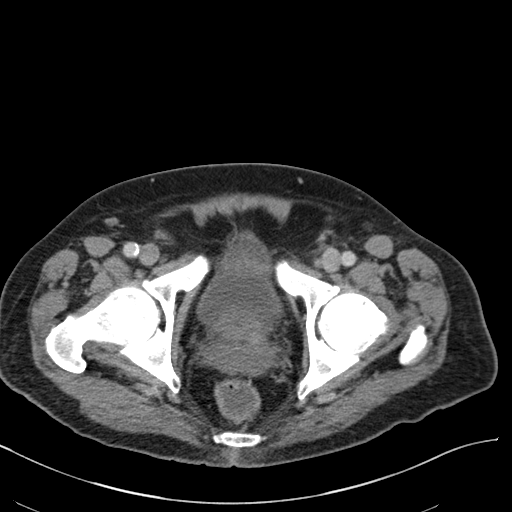
[im 37/102  soft-tissue]
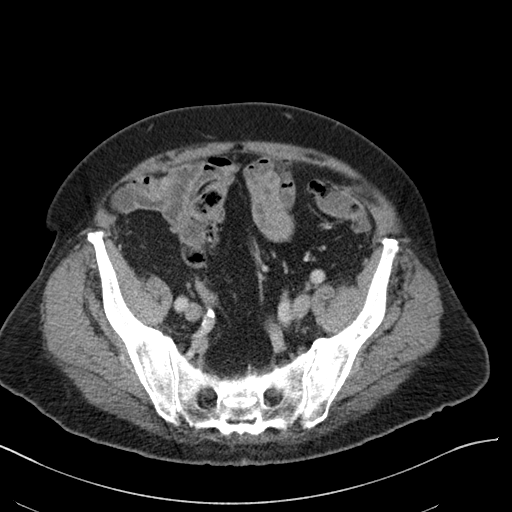
[im 44/102  soft-tissue]
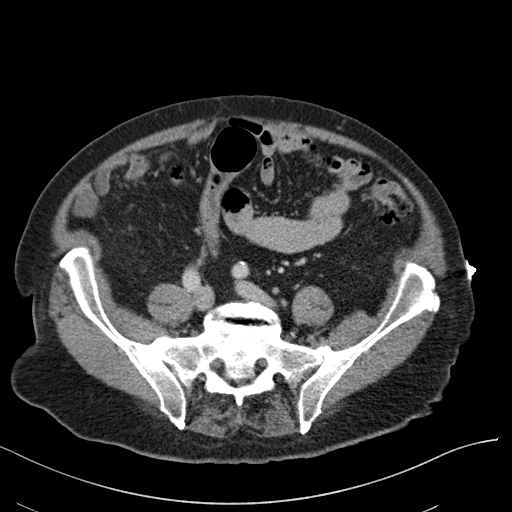
[im 51/102  soft-tissue]
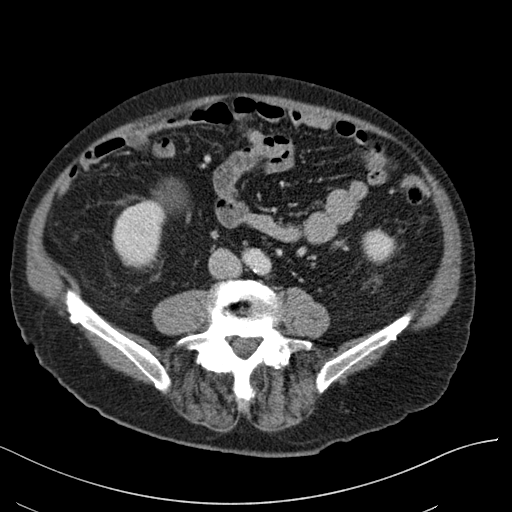
[im 58/102  soft-tissue]
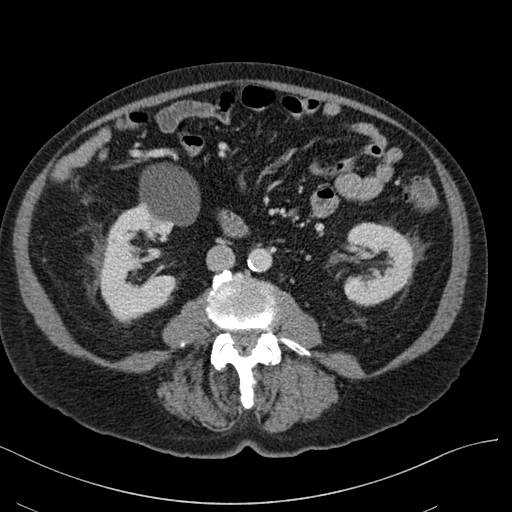
[im 65/102  soft-tissue]
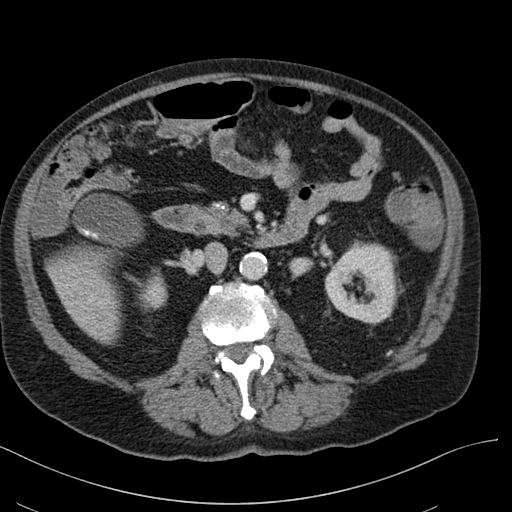
[im 80/102  soft-tissue]
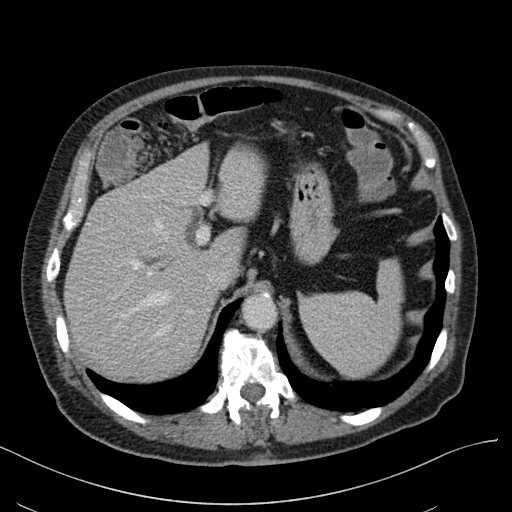
[im 80/102  bone]
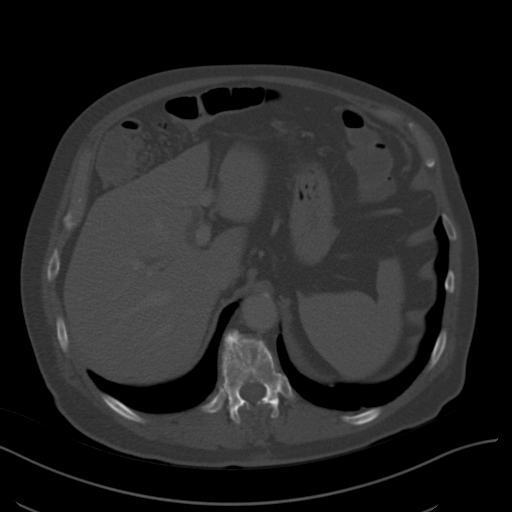
[im 87/102  soft-tissue]
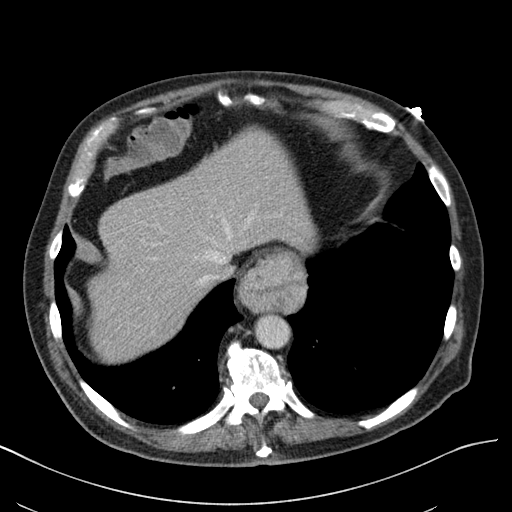
[im 94/102  soft-tissue]
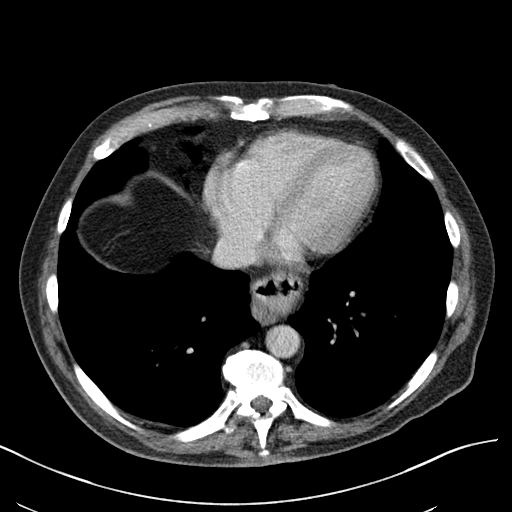

[Series 5: coronal st · coronal · 0.72mm/px · 3 of 151 slices shown]
[im 51/151  soft-tissue]
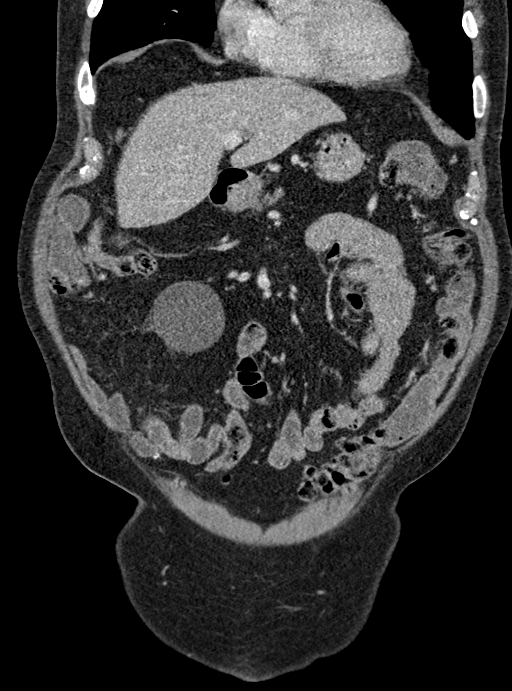
[im 67/151  soft-tissue]
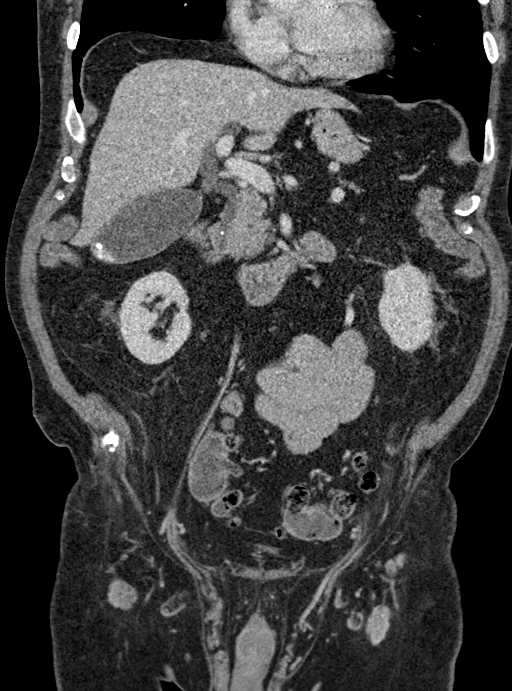
[im 84/151  soft-tissue]
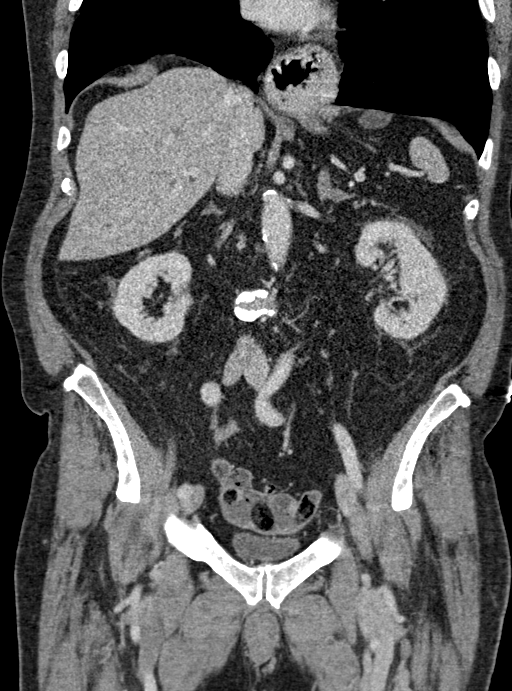

[14 of 46 positions shown; findings below may reference images not displayed]

FINDINGS: Lower chest: Minimal scarring in the lingula. Very small
(approximate 3 mm) nodule in the LATERAL segment RIGHT MIDDLE LOBE
(series 4, image 10). Visualized lung bases otherwise clear. Heart
size normal. Mild RIGHT coronary artery atherosclerosis.

Hepatobiliary: Mild intra and extrahepatic biliary ductal dilation,
the common duct measuring up to 13 mm diameter. Very small
(approximate 2 mm) stone in the distal common bile duct, though the
duct is dilated beyond this stone, so this stone is not causing
obstruction. Multiple small calcified gallstones in the gallbladder,
the largest on the order of 6 mm. No evidence of pericholecystic
edema or inflammation.

No focal hepatic parenchymal abnormalities.

Pancreas: Markedly atrophic pancreatic body and tail. Calcifications
involving the pancreatic head. No pancreatic mass or peripancreatic
inflammation.

Spleen: Normal in size and appearance.

Adrenals/Urinary Tract: Approximate 1.3 x 1.1 x 0.9 cm
low-attenuation nodule involving the LEFT adrenal gland. Normal
appearing RIGHT adrenal gland. Benign simple cyst arising from the
ANTERIOR LOWER pole of the RIGHT kidney measuring approximately
x 5.2 cm. No solid masses involving either kidney. No
hydronephrosis. No opaque urinary tract calculi. Normal appearing
decompressed urinary bladder.

Stomach/Bowel: Moderate-sized hiatal hernia. Stomach decompressed
and otherwise normal in appearance. Normal-appearing small bowel.
Diffuse colonic diverticulosis, most extensive in the sigmoid
region, without evidence of acute diverticulitis. Liquid stool
throughout the colon. Mobile cecum positioned in the RIGHT UPPER
QUADRANT. Appendix not conspicuous, but no pericecal inflammation.

Vascular/Lymphatic: Moderate aorto-iliofemoral atherosclerosis
without evidence of aneurysm. Normal-appearing portal venous and
systemic venous systems.

No pathologic lymphadenopathy.

Reproductive: Moderate to marked prostate gland enlargement.
Asymmetric enlargement of the LEFT seminal vesicles.

Other: Small BILATERAL inguinal hernias containing fat, LEFT larger
than RIGHT.

Musculoskeletal: Multilevel degenerative disc disease and
spondylosis throughout the lumbar spine. Facet degenerative changes
throughout the lumbar spine. Desiccated disc extrusion at L5-S1 with
INFERIOR migration of the desiccated disc fragment. LOWER thoracic
spondylosis. No acute findings.
IMPRESSION: 1. Choledocholithiasis, with a very small (approximate 2 mm) stone
in the distal common bile duct; as the duct is dilated distal to
this stone, it is not causing obstruction.
2. Cholelithiasis without CT evidence of acute cholecystitis.
3. Moderate-sized hiatal hernia.
4. Diffuse colonic diverticulosis without evidence of acute
diverticulitis. Liquid stool throughout the colon.
5. Moderate to marked prostate gland enlargement. Asymmetric
enlargement of the LEFT seminal vesicles. Correlate with PSA in
order to exclude a prostate malignancy with LEFT seminal vesicle
involvement.
6. Small BILATERAL inguinal hernias containing fat, LEFT larger than
RIGHT.
7. Likely benign LEFT adrenal adenoma.
8. Very small 3 mm nodule in the LATERAL segment of the visualized
RIGHT MIDDLE LOBE. Consider non-emergent CT chest to exclude other
nodules elsewhere.

Aortic Atherosclerosis (SGH92-OH7.7).

## 2021-02-03 DIAGNOSIS — E119 Type 2 diabetes mellitus without complications: Secondary | ICD-10-CM | POA: Diagnosis not present

## 2021-02-04 DIAGNOSIS — E1142 Type 2 diabetes mellitus with diabetic polyneuropathy: Secondary | ICD-10-CM | POA: Diagnosis not present

## 2021-02-04 DIAGNOSIS — R2681 Unsteadiness on feet: Secondary | ICD-10-CM | POA: Diagnosis not present

## 2021-02-04 DIAGNOSIS — I10 Essential (primary) hypertension: Secondary | ICD-10-CM | POA: Diagnosis not present

## 2021-02-04 DIAGNOSIS — M8588 Other specified disorders of bone density and structure, other site: Secondary | ICD-10-CM | POA: Diagnosis not present

## 2021-02-04 DIAGNOSIS — N400 Enlarged prostate without lower urinary tract symptoms: Secondary | ICD-10-CM | POA: Diagnosis not present

## 2021-02-04 DIAGNOSIS — E11621 Type 2 diabetes mellitus with foot ulcer: Secondary | ICD-10-CM | POA: Diagnosis not present

## 2021-02-04 DIAGNOSIS — L97521 Non-pressure chronic ulcer of other part of left foot limited to breakdown of skin: Secondary | ICD-10-CM | POA: Diagnosis not present

## 2021-02-04 DIAGNOSIS — M545 Low back pain, unspecified: Secondary | ICD-10-CM | POA: Diagnosis not present

## 2021-02-04 DIAGNOSIS — E1165 Type 2 diabetes mellitus with hyperglycemia: Secondary | ICD-10-CM | POA: Diagnosis not present

## 2021-02-04 DIAGNOSIS — K219 Gastro-esophageal reflux disease without esophagitis: Secondary | ICD-10-CM | POA: Diagnosis not present

## 2021-02-04 DIAGNOSIS — E782 Mixed hyperlipidemia: Secondary | ICD-10-CM | POA: Diagnosis not present

## 2021-02-05 DIAGNOSIS — R2689 Other abnormalities of gait and mobility: Secondary | ICD-10-CM | POA: Diagnosis not present

## 2021-02-05 DIAGNOSIS — M5459 Other low back pain: Secondary | ICD-10-CM | POA: Diagnosis not present

## 2021-02-05 DIAGNOSIS — M6281 Muscle weakness (generalized): Secondary | ICD-10-CM | POA: Diagnosis not present

## 2021-02-12 DIAGNOSIS — R2689 Other abnormalities of gait and mobility: Secondary | ICD-10-CM | POA: Diagnosis not present

## 2021-02-12 DIAGNOSIS — M6281 Muscle weakness (generalized): Secondary | ICD-10-CM | POA: Diagnosis not present

## 2021-02-12 DIAGNOSIS — M5459 Other low back pain: Secondary | ICD-10-CM | POA: Diagnosis not present

## 2021-02-19 DIAGNOSIS — M5459 Other low back pain: Secondary | ICD-10-CM | POA: Diagnosis not present

## 2021-02-19 DIAGNOSIS — M6281 Muscle weakness (generalized): Secondary | ICD-10-CM | POA: Diagnosis not present

## 2021-02-19 DIAGNOSIS — R2689 Other abnormalities of gait and mobility: Secondary | ICD-10-CM | POA: Diagnosis not present

## 2021-03-31 DIAGNOSIS — M8588 Other specified disorders of bone density and structure, other site: Secondary | ICD-10-CM | POA: Diagnosis not present

## 2021-03-31 DIAGNOSIS — Z13820 Encounter for screening for osteoporosis: Secondary | ICD-10-CM | POA: Diagnosis not present

## 2021-04-05 DIAGNOSIS — Z23 Encounter for immunization: Secondary | ICD-10-CM | POA: Diagnosis not present

## 2021-05-21 DIAGNOSIS — L040 Acute lymphadenitis of face, head and neck: Secondary | ICD-10-CM | POA: Diagnosis not present

## 2021-05-21 DIAGNOSIS — I4891 Unspecified atrial fibrillation: Secondary | ICD-10-CM | POA: Diagnosis not present

## 2021-05-21 DIAGNOSIS — I10 Essential (primary) hypertension: Secondary | ICD-10-CM | POA: Diagnosis not present

## 2021-05-22 DIAGNOSIS — I4891 Unspecified atrial fibrillation: Secondary | ICD-10-CM | POA: Diagnosis not present

## 2021-05-23 DIAGNOSIS — G459 Transient cerebral ischemic attack, unspecified: Secondary | ICD-10-CM | POA: Diagnosis not present

## 2021-05-23 DIAGNOSIS — Z7901 Long term (current) use of anticoagulants: Secondary | ICD-10-CM | POA: Diagnosis not present

## 2021-05-23 DIAGNOSIS — K449 Diaphragmatic hernia without obstruction or gangrene: Secondary | ICD-10-CM | POA: Diagnosis not present

## 2021-05-23 DIAGNOSIS — Z20822 Contact with and (suspected) exposure to covid-19: Secondary | ICD-10-CM | POA: Diagnosis not present

## 2021-05-23 DIAGNOSIS — R410 Disorientation, unspecified: Secondary | ICD-10-CM | POA: Diagnosis not present

## 2021-05-23 DIAGNOSIS — R531 Weakness: Secondary | ICD-10-CM | POA: Diagnosis not present

## 2021-05-23 DIAGNOSIS — E119 Type 2 diabetes mellitus without complications: Secondary | ICD-10-CM | POA: Diagnosis not present

## 2021-05-28 DIAGNOSIS — I4891 Unspecified atrial fibrillation: Secondary | ICD-10-CM | POA: Diagnosis not present

## 2021-05-28 DIAGNOSIS — Z7901 Long term (current) use of anticoagulants: Secondary | ICD-10-CM | POA: Diagnosis not present

## 2021-05-28 DIAGNOSIS — I480 Paroxysmal atrial fibrillation: Secondary | ICD-10-CM | POA: Diagnosis not present

## 2021-05-28 DIAGNOSIS — I10 Essential (primary) hypertension: Secondary | ICD-10-CM | POA: Diagnosis not present

## 2021-05-28 DIAGNOSIS — E1165 Type 2 diabetes mellitus with hyperglycemia: Secondary | ICD-10-CM | POA: Diagnosis not present

## 2021-05-28 DIAGNOSIS — Z8673 Personal history of transient ischemic attack (TIA), and cerebral infarction without residual deficits: Secondary | ICD-10-CM | POA: Diagnosis not present

## 2022-07-19 ENCOUNTER — Other Ambulatory Visit: Payer: Self-pay | Admitting: Orthopaedic Surgery

## 2022-07-19 DIAGNOSIS — Z01818 Encounter for other preprocedural examination: Secondary | ICD-10-CM

## 2022-08-15 ENCOUNTER — Ambulatory Visit
Admission: RE | Admit: 2022-08-15 | Discharge: 2022-08-15 | Disposition: A | Discharge status: Home / Self Care | Payer: PPO | Source: Ambulatory Visit | Attending: Orthopaedic Surgery | Admitting: Orthopaedic Surgery

## 2022-08-15 DIAGNOSIS — Z01818 Encounter for other preprocedural examination: Secondary | ICD-10-CM

## 2022-10-20 ENCOUNTER — Encounter (HOSPITAL_BASED_OUTPATIENT_CLINIC_OR_DEPARTMENT_OTHER): Admission: RE | Payer: Self-pay | Source: Home / Self Care

## 2022-10-20 ENCOUNTER — Ambulatory Visit (HOSPITAL_BASED_OUTPATIENT_CLINIC_OR_DEPARTMENT_OTHER): Admission: RE | Admit: 2022-10-20 | Payer: PPO | Source: Home / Self Care | Admitting: Orthopaedic Surgery

## 2022-10-20 SURGERY — ARTHROPLASTY, SHOULDER, TOTAL, REVERSE
Anesthesia: General | Site: Shoulder | Laterality: Left

## 2023-07-27 ENCOUNTER — Other Ambulatory Visit (HOSPITAL_BASED_OUTPATIENT_CLINIC_OR_DEPARTMENT_OTHER): Payer: Self-pay

## 2023-07-27 ENCOUNTER — Encounter (HOSPITAL_BASED_OUTPATIENT_CLINIC_OR_DEPARTMENT_OTHER): Payer: Self-pay | Admitting: Emergency Medicine

## 2023-07-27 ENCOUNTER — Ambulatory Visit (HOSPITAL_BASED_OUTPATIENT_CLINIC_OR_DEPARTMENT_OTHER)
Admission: EM | Admit: 2023-07-27 | Discharge: 2023-07-27 | Disposition: A | Discharge status: Home / Self Care | Payer: PPO

## 2023-07-27 ENCOUNTER — Ambulatory Visit (HOSPITAL_BASED_OUTPATIENT_CLINIC_OR_DEPARTMENT_OTHER): Payer: PPO | Admitting: Radiology

## 2023-07-27 DIAGNOSIS — R1084 Generalized abdominal pain: Secondary | ICD-10-CM

## 2023-07-27 DIAGNOSIS — R140 Abdominal distension (gaseous): Secondary | ICD-10-CM | POA: Diagnosis not present

## 2023-07-27 DIAGNOSIS — R110 Nausea: Secondary | ICD-10-CM

## 2023-07-27 DIAGNOSIS — K5900 Constipation, unspecified: Secondary | ICD-10-CM | POA: Diagnosis not present

## 2023-07-27 MED ORDER — POLYETHYLENE GLYCOL 3350 17 GM/SCOOP PO POWD
ORAL | 0 refills | Status: AC
  Filled 2023-07-27: qty 238, 14d supply, fill #0

## 2023-07-27 MED ORDER — ONDANSETRON 4 MG PO TBDP
4.0000 mg | ORAL_TABLET | Freq: Three times a day (TID) | ORAL | 0 refills | Status: AC | PRN
  Filled 2023-07-27: qty 20, 7d supply, fill #0

## 2023-07-27 NOTE — ED Provider Notes (Signed)
[facility]    CSN: [id number] Arrival date & time: 07/27/23  1628      History   Chief Complaint No chief complaint on file.   HPI Timothy Farrell is a 88 y.o. male.   Patient normally has bowel movement daily.  He had 3 small bowel movements yesterday morning.  He has not had a bowel movement since that time.  He has had abdominal pain off and on since yesterday midday.  He feels like he needs to have a bowel movement but cannot.  He thinks this could be gas pain.  This morning he started with nausea but has not vomited.  He has tried milk of magnesia and a suppository for constipation but has not had a bowel movement even with both options.  He has little or no appetite.  He denies fever, diarrhea, vomiting.  In August 2020 he had ERCP and then a laparoscopic cholecystectomy.  He reports that at his most recent colonoscopy, he was told he had lots of diverticula.  He does not remember actual diverticulitis in his history.     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    Past Surgical History:  Procedure Laterality Date   CHOLECYSTECTOMY N/A 02/07/2019   Procedure: LAPAROSCOPIC CHOLECYSTECTOMY WITH INTRAOPERATIVE CHOLANGIOGRAM;  Surgeon: Griselda Miner, MD;  Location: [facility];  Service: General;  Laterality: N/A;   COLONOSCOPY     ERCP N/A 02/05/2019   Procedure: ENDOSCOPIC RETROGRADE CHOLANGIOPANCREATOGRAPHY (ERCP);  Surgeon: Jeani Hawking, MD;  Location: [facility];   Service: Endoscopy;  Laterality: N/A;   FRACTURE SURGERY Left 2005   plate and screws   SPHINCTEROTOMY  02/05/2019   Procedure: SPHINCTEROTOMY;  Surgeon: Jeani Hawking, MD;  Location: [facility];  Service: Endoscopy;;  balloon sweep   TONSILLECTOMY     TOTAL KNEE ARTHROPLASTY Left 01/14/2019   Procedure: TOTAL KNEE ARTHROPLASTY;  Surgeon: Dannielle Huh, MD;  Location: [facility];  Service: Orthopedics;  Laterality: Left;   VASCULAR SURGERY  1960   rt leg       Home Medications    Prior to Admission medications   Medication Sig Start Date End Date Taking? Authorizing Provider  amLODipine (NORVASC) 5 MG tablet Take 5 mg by mouth daily.   Yes [provider]  aspirin EC 81 MG tablet Take 81 mg by mouth daily.   Yes [provider]  carvedilol (COREG) 12.5 MG tablet Take 12.5 mg by mouth 2 (two) times daily with a meal.   Yes [provider]  glyBURIDE (DIABETA) 2.5 MG tablet Take 2.5 mg by mouth daily at 12 noon.   Yes [provider]  lisinopril (ZESTRIL) 20 MG tablet Take 10 mg by mouth at bedtime.   Yes [provider]  lovastatin (MEVACOR) 40 MG tablet Take 40 mg by mouth at bedtime.   Yes [provider]  metFORMIN (GLUCOPHAGE) 1000 MG tablet Take 1,000 mg by mouth 2 (two) times a day.   Yes [provider]  omeprazole (PRILOSEC OTC) 20 MG tablet Take 20 mg by mouth daily.   Yes [provider]  ondansetron (ZOFRAN-ODT) 4 MG disintegrating tablet Take 1 tablet (4 mg total) by mouth every 8 (eight) hours as needed for nausea or vomiting. 07/27/23  Yes Prescilla Sours, FNP  polyethylene glycol powder (GLYCOLAX/MIRALAX) 17 GM/SCOOP powder Mix one capful in 8 oz liquid and drink three times daily for 1-3 days to get empty of stool.  Switch to a clear liquid diet for now, until empty of stool 07/27/23  Yes Prescilla Sours, FNP  vitamin C (ASCORBIC ACID) 500 MG tablet Take 500 mg by mouth daily at 12 noon.   Yes [provider]  acetaminophen (TYLENOL) 500 MG tablet Take 500-1,000 mg by mouth every 6 (six) hours as needed for moderate pain or headache.    [provider]  B COMPLEX-ZINC PO Take 1 tablet by mouth daily at 12 noon.    [provider]  COD LIVER OIL PO Take 1 capsule by mouth daily at 12 noon.    [provider]  ELIQUIS 5 MG TABS tablet Take 5 mg by mouth 2 (two) times daily.    [provider]  methocarbamol (ROBAXIN) 500 MG tablet Take 1-2 tablets (500-1,000 mg total) by mouth every 6 (six) hours as needed for muscle spasms. 01/15/19   Guy Sandifer, PA  Multiple Vitamins-Minerals (CENTRUM SILVER 50+MEN PO) Take 1 tablet by mouth daily.    [provider]  oxyCODONE (OXY IR/ROXICODONE) 5 MG immediate release tablet Take 1 tablet (5 mg total) by mouth every 6 (six) hours as needed for breakthrough pain. 02/08/19   Maczis, Elmer Sow, PA-C  Probiotic CAPS Take 1 capsule by mouth daily.    [provider]    Family History Family History  Problem Relation Age of Onset   Diabetes Mother    Hypertension Father    Heart disease Father     Social History Social History   Tobacco Use   Smoking status: Never   Smokeless tobacco: Never  Vaping Use   Vaping status: Never Used  Substance Use Topics   Alcohol use: Never   Drug use: Never     Allergies   Patient has no known allergies.   Review of Systems Review of Systems  Constitutional:  Negative for chills and fever.  HENT:  Negative for ear pain and sore throat.   Eyes:  Negative for pain and visual disturbance.  Respiratory:  Negative for cough and shortness of breath.   Cardiovascular:  Negative for chest pain and palpitations.  Gastrointestinal:  Positive for abdominal pain. Negative for vomiting.  Genitourinary:  Negative for dysuria and hematuria.  Musculoskeletal:  Negative for arthralgias and back pain.  Skin:  Negative for color change and rash.   Neurological:  Negative for seizures and syncope.  All other systems reviewed and are negative.    Physical Exam Triage Vital Signs ED Triage Vitals  Encounter Vitals Group     BP 07/27/23 1640 (!) 157/73     Systolic BP Percentile --      Diastolic BP Percentile --      Pulse Rate 07/27/23 1640 81     Resp 07/27/23 1640 20     Temp 07/27/23 1640 98.3 F (36.8 C)     Temp Source 07/27/23 1640 Oral     SpO2 07/27/23 1640 96 %     Weight --  Height --      Head Circumference --      Peak Flow --      Pain Score 07/27/23 1637 6     Pain Loc --      Pain Education --      Exclude from Growth Chart --    No data found.  Updated Vital Signs BP (!) 157/73 (BP Location: Right Arm)   Pulse 81   Temp 98.3 F (36.8 C) (Oral)   Resp 20   SpO2 96%   Visual Acuity Right Eye Distance:   Left Eye Distance:   Bilateral Distance:    Right Eye Near:   Left Eye Near:    Bilateral Near:     Physical Exam Vitals and nursing note reviewed.  Constitutional:      General: He is not in acute distress.    Appearance: He is well-developed. He is not ill-appearing or toxic-appearing.  HENT:     Head: Normocephalic and atraumatic.     Right Ear: Hearing, tympanic membrane, ear canal and external ear normal.     Left Ear: There is impacted cerumen (Not obstructive but moderate amount of cerumen.  TM is visible.).     Nose: Nose normal.     Mouth/Throat:     Lips: Pink.     Mouth: Mucous membranes are moist.     Pharynx: Uvula midline. No oropharyngeal exudate or posterior oropharyngeal erythema.     Tonsils: No tonsillar exudate.  Eyes:     Conjunctiva/sclera: Conjunctivae normal.     Pupils: Pupils are equal, round, and reactive to light.  Cardiovascular:     Rate and Rhythm: Normal rate and regular rhythm.     Heart sounds: S1 normal and S2 normal. No murmur heard. Pulmonary:     Effort: Pulmonary effort is normal. No respiratory distress.     Breath sounds: Normal  breath sounds. No decreased breath sounds, wheezing, rhonchi or rales.  Abdominal:     Palpations: Abdomen is soft.     Tenderness: There is generalized abdominal tenderness (Moderate tenderness.). There is no right CVA tenderness, left CVA tenderness, guarding or rebound. Negative signs include Murphy's sign, Rovsing's sign and McBurney's sign.     Comments: He was able to jump up and down and had only minimal abdominal pain with jumping.  Musculoskeletal:        General: No swelling.     Cervical back: Neck supple.  Lymphadenopathy:     Head:     Right side of head: No submental, submandibular, tonsillar, preauricular or posterior auricular adenopathy.     Left side of head: No submental, submandibular, tonsillar, preauricular or posterior auricular adenopathy.     Cervical: No cervical adenopathy.     Right cervical: No superficial cervical adenopathy.    Left cervical: No superficial cervical adenopathy.  Skin:    General: Skin is warm and dry.     Capillary Refill: Capillary refill takes less than 2 seconds.     Findings: No rash.  Neurological:     Mental Status: He is alert and oriented to person, place, and time.  Psychiatric:        Mood and Affect: Mood normal.      UC Treatments / Results  Labs (all labs ordered are listed, but only abnormal results are displayed) Labs Reviewed  CBC WITH DIFFERENTIAL/PLATELET  COMPREHENSIVE METABOLIC PANEL    EKG   Radiology No results found.  Procedures Procedures (including critical care  time)  Medications Ordered in UC Medications - No data to display  Initial Impression / Assessment and Plan / UC Course  I have reviewed the triage vital signs and the nursing notes.  Pertinent labs & imaging results that were available during my care of the patient were reviewed by me and considered in my medical decision making (see chart for details).     KUB appears to show a lot of stool and gas.  Patient needs to get empty of  stool.  MiraLAX powder, 1 capful, 3 times a day for 1 to 3 days until he feels like he is empty of stool.  Drink clear liquids and bland foods.  Avoid bearing weight and strength recovery 3 to 4 days.  BC with differential and CMP are pending.  Will contact patient if the test results are abnormal.  If her test results are normal they will be available on the portal.  Ondansetron ODT, 4 mg, every 8 hours, as needed for nausea and vomiting.  See below for constipation recipe.  Follow-up here or with primary care or GI or even an emergency room, if symptoms do not improve, worsen or new symptoms occur.  Reviewed signs and symptoms of diverticulitis and reasons to go to the emergency room. Final Clinical Impressions(s) / UC Diagnoses   Final diagnoses:  Generalized abdominal pain  Constipation, unspecified constipation type  Abdominal bloating  Nausea without vomiting     Discharge Instructions      Abdominal X-Ray shows moderate stool and gas in the abdomen.  Needs to get empty of stool.  Take MiraLAX powder, 1 capful, 3 times a day for 1 to 3 days until he feels that he is totally empty of stool.  Drink clear liquids for now.  He could have very bland food like chicken noodle soup, applesauce, bananas.  Avoid heavy meals and avoid dairy for the next 2 to 4 days.  CBC with differential and CMP are pending.  Will contact the patient if the test results are abnormal.  If the test results are normal they will be available on the portal.  See below for the constipation recipe to help keep regular and avoid constipation.  Provided ondansetron, 4 mg ODT, melt on tongue, every 8 hours, as needed for nausea and vomiting.  If symptoms do not improve, worsen or new symptoms occur.  May need to see gastroenterology or go to an emergency room for CT scan.  Signs of diverticulitis include but are not limited to abdominal pain, fever, constipation, vomiting.   Constipation Recipe:  Mix together: ? 1  cup apple sauce ? 1 cup oat bran or unprocessed wheat bran ?  cup prune juice This recipe helps to increase dietary fiber intake and promotes regular bowel function. You may have a bloated feeling and more gas when adding fiber to your diet, but this should pass in a few weeks. Begin with 1-2 tablespoons each evening mixed with or followed by one 6-8 ounce cup of water or juice. After two weeks you will have softer and more regular bowel movements. If no change occurs, slowly increase the amount to 3-4 tablespoons. Plan to make this part of your daily routine for the rest of your lifetime.  You can store the mixture in your refrigerator or freezer. You can also freeze 1-2 tablespoon servings in sectioned ice cube trays or in foam plastic egg cartons and thaw as needed.        ED Prescriptions  Medication Sig Dispense Auth. Provider   ondansetron (ZOFRAN-ODT) 4 MG disintegrating tablet Take 1 tablet (4 mg total) by mouth every 8 (eight) hours as needed for nausea or vomiting. 20 tablet Prescilla Sours, FNP   polyethylene glycol powder (GLYCOLAX/MIRALAX) 17 GM/SCOOP powder Mix one capful in 8 oz liquid and drink three times daily for 1-3 days to get empty of stool.  Switch to a clear liquid diet for now, until empty of stool 238 g Prescilla Sours, FNP      PDMP not reviewed this encounter.   Prescilla Sours, FNP 07/27/23 [phone]

## 2023-07-27 NOTE — Discharge Instructions (Addendum)
Abdominal X-Ray shows moderate stool and gas in the abdomen.  Needs to get empty of stool.  Take MiraLAX powder, 1 capful, 3 times a day for 1 to 3 days until he feels that he is totally empty of stool.  Drink clear liquids for now.  He could have very bland food like chicken noodle soup, applesauce, bananas.  Avoid heavy meals and avoid dairy for the next 2 to 4 days.  CBC with differential and CMP are pending.  Will contact the patient if the test results are abnormal.  If the test results are normal they will be available on the portal.  See below for the constipation recipe to help keep regular and avoid constipation.  Provided ondansetron, 4 mg ODT, melt on tongue, every 8 hours, as needed for nausea and vomiting.  If symptoms do not improve, worsen or new symptoms occur.  May need to see gastroenterology or go to an emergency room for CT scan.  Signs of diverticulitis include but are not limited to abdominal pain, fever, constipation, vomiting.   Constipation Recipe:  Mix together: ? 1 cup apple sauce ? 1 cup oat bran or unprocessed wheat bran ?  cup prune juice This recipe helps to increase dietary fiber intake and promotes regular bowel function. You may have a bloated feeling and more gas when adding fiber to your diet, but this should pass in a few weeks. Begin with 1-2 tablespoons each evening mixed with or followed by one 6-8 ounce cup of water or juice. After two weeks you will have softer and more regular bowel movements. If no change occurs, slowly increase the amount to 3-4 tablespoons. Plan to make this part of your daily routine for the rest of your lifetime.  You can store the mixture in your refrigerator or freezer. You can also freeze 1-2 tablespoon servings in sectioned ice cube trays or in foam plastic egg cartons and thaw as needed.

## 2023-07-27 NOTE — Progress Notes (Signed)
KUB or abdominal x-ray shows moderate bowel gas and a moderate stool in the colon.  Encouraged to get empty of stool with laxatives.  Patient and his wife updated on report findings tonight.

## 2023-07-27 NOTE — ED Triage Notes (Signed)
Pt c/o stomach pain started last night, Nausea started today, pt has taken milk of magnesium, suppository felt like maybe gas issue but no better after taking medication.

## 2023-07-28 LAB — CBC WITH DIFFERENTIAL/PLATELET
Basophils Absolute: 0 10*3/uL (ref 0.0–0.2)
Basos: 0 %
EOS (ABSOLUTE): 0.1 10*3/uL (ref 0.0–0.4)
Eos: 0 %
Hematocrit: 38.1 % (ref 37.5–51.0)
Hemoglobin: 13.3 g/dL (ref 13.0–17.7)
Immature Grans (Abs): 0 10*3/uL (ref 0.0–0.1)
Immature Granulocytes: 0 %
Lymphocytes Absolute: 1.1 10*3/uL (ref 0.7–3.1)
Lymphs: 8 %
MCH: 33.9 pg — ABNORMAL HIGH (ref 26.6–33.0)
MCHC: 34.9 g/dL (ref 31.5–35.7)
MCV: 97 fL (ref 79–97)
Monocytes Absolute: 1.5 10*3/uL — ABNORMAL HIGH (ref 0.1–0.9)
Monocytes: 12 %
Neutrophils Absolute: 10.5 10*3/uL — ABNORMAL HIGH (ref 1.4–7.0)
Neutrophils: 80 %
Platelets: 189 10*3/uL (ref 150–450)
RBC: 3.92 x10E6/uL — ABNORMAL LOW (ref 4.14–5.80)
RDW: 11.8 % (ref 11.6–15.4)
WBC: 13.2 10*3/uL — ABNORMAL HIGH (ref 3.4–10.8)

## 2023-07-28 LAB — COMPREHENSIVE METABOLIC PANEL
ALT: 18 [IU]/L (ref 0–44)
AST: 17 [IU]/L (ref 0–40)
Albumin: 4.5 g/dL (ref 3.7–4.7)
Alkaline Phosphatase: 59 [IU]/L (ref 44–121)
BUN/Creatinine Ratio: 15 (ref 10–24)
BUN: 11 mg/dL (ref 8–27)
Bilirubin Total: 0.5 mg/dL (ref 0.0–1.2)
CO2: 21 mmol/L (ref 20–29)
Calcium: 8.9 mg/dL (ref 8.6–10.2)
Chloride: 92 mmol/L — ABNORMAL LOW (ref 96–106)
Creatinine, Ser: 0.73 mg/dL — ABNORMAL LOW (ref 0.76–1.27)
Globulin, Total: 2.4 g/dL (ref 1.5–4.5)
Glucose: 150 mg/dL — ABNORMAL HIGH (ref 70–99)
Potassium: 4.4 mmol/L (ref 3.5–5.2)
Sodium: 129 mmol/L — ABNORMAL LOW (ref 134–144)
Total Protein: 6.9 g/dL (ref 6.0–8.5)
eGFR: 89 mL/min/{1.73_m2} (ref >59)
